# Patient Record
Sex: Female | Born: 1960 | Race: White | Hispanic: No | Smoking: Current every day smoker
Health system: Southern US, Community
[De-identification: ages and names within clinical notes are randomized; demographics above are authoritative.]

## PROBLEM LIST (undated history)

## (undated) DIAGNOSIS — I2510 Atherosclerotic heart disease of native coronary artery without angina pectoris: Secondary | ICD-10-CM

## (undated) DIAGNOSIS — E785 Hyperlipidemia, unspecified: Secondary | ICD-10-CM

## (undated) DIAGNOSIS — Z720 Tobacco use: Secondary | ICD-10-CM

## (undated) DIAGNOSIS — C55 Malignant neoplasm of uterus, part unspecified: Secondary | ICD-10-CM

## (undated) HISTORY — PX: ABDOMINAL HYSTERECTOMY: SHX81

---

## 1997-05-16 ENCOUNTER — Emergency Department (HOSPITAL_COMMUNITY): Admission: EM | Admit: 1997-05-16 | Discharge: 1997-05-16 | Payer: Self-pay | Admitting: Emergency Medicine

## 1999-02-08 ENCOUNTER — Emergency Department (HOSPITAL_COMMUNITY): Admission: EM | Admit: 1999-02-08 | Discharge: 1999-02-08 | Payer: Self-pay | Admitting: Emergency Medicine

## 2000-06-04 ENCOUNTER — Other Ambulatory Visit: Admission: RE | Admit: 2000-06-04 | Discharge: 2000-06-04 | Payer: Self-pay | Admitting: *Deleted

## 2007-11-05 ENCOUNTER — Encounter: Admission: RE | Admit: 2007-11-05 | Discharge: 2007-11-05 | Payer: Self-pay | Admitting: Occupational Medicine

## 2009-03-21 ENCOUNTER — Emergency Department (HOSPITAL_COMMUNITY): Admission: EM | Admit: 2009-03-21 | Discharge: 2009-03-21 | Payer: Self-pay | Admitting: Emergency Medicine

## 2011-03-10 ENCOUNTER — Other Ambulatory Visit: Payer: Self-pay

## 2011-03-10 ENCOUNTER — Emergency Department (HOSPITAL_COMMUNITY): Payer: Self-pay

## 2011-03-10 ENCOUNTER — Encounter (HOSPITAL_COMMUNITY): Payer: Self-pay | Admitting: Emergency Medicine

## 2011-03-10 ENCOUNTER — Emergency Department (HOSPITAL_COMMUNITY)
Admission: EM | Admit: 2011-03-10 | Discharge: 2011-03-10 | Disposition: A | Discharge status: Home Health | Payer: Self-pay | Attending: Emergency Medicine | Admitting: Emergency Medicine

## 2011-03-10 DIAGNOSIS — J449 Chronic obstructive pulmonary disease, unspecified: Secondary | ICD-10-CM | POA: Insufficient documentation

## 2011-03-10 DIAGNOSIS — R0602 Shortness of breath: Secondary | ICD-10-CM | POA: Insufficient documentation

## 2011-03-10 DIAGNOSIS — F17200 Nicotine dependence, unspecified, uncomplicated: Secondary | ICD-10-CM | POA: Insufficient documentation

## 2011-03-10 DIAGNOSIS — J4489 Other specified chronic obstructive pulmonary disease: Secondary | ICD-10-CM | POA: Insufficient documentation

## 2011-03-10 DIAGNOSIS — R079 Chest pain, unspecified: Secondary | ICD-10-CM | POA: Insufficient documentation

## 2011-03-10 LAB — DIFFERENTIAL
Basophils Absolute: 0 10*3/uL (ref 0.0–0.1)
Eosinophils Absolute: 0.1 10*3/uL (ref 0.0–0.7)
Eosinophils Relative: 2 % (ref 0–5)
Monocytes Absolute: 0.3 10*3/uL (ref 0.1–1.0)

## 2011-03-10 LAB — CBC
Hemoglobin: 14.2 g/dL (ref 12.0–15.0)
MCHC: 32.3 g/dL (ref 30.0–36.0)
WBC: 6.4 10*3/uL (ref 4.0–10.5)

## 2011-03-10 LAB — BASIC METABOLIC PANEL
Calcium: 9.3 mg/dL (ref 8.4–10.5)
Sodium: 144 mEq/L (ref 135–145)

## 2011-03-10 MED ORDER — ALBUTEROL SULFATE HFA 108 (90 BASE) MCG/ACT IN AERS
2.0000 | INHALATION_SPRAY | RESPIRATORY_TRACT | Status: DC | PRN
  Administered 2011-03-10: 2 via RESPIRATORY_TRACT
  Filled 2011-03-10: qty 6.7

## 2011-03-10 MED ORDER — AEROCHAMBER PLUS W/MASK MISC
Status: AC
  Administered 2011-03-10: 22:00:00
  Filled 2011-03-10: qty 1

## 2011-03-10 NOTE — Discharge Instructions (Signed)
Chronic Obstructive Pulmonary Disease Chronic obstructive pulmonary disease (COPD) is a lung disease. The lungs become damaged, making it hard to get air in and out of your lungs. The damage to your lungs cannot be changed.  HOME CARE  Stop smoking if you smoke. Avoid secondhand smoke.   Only take medicine as told by your doctor.   Talk to your doctor about using cough syrup or over-the-counter medicines.   Drink enough fluids to keep your pee (urine) clear or pale yellow.   Use a humidifier or vaporizer. This may help loosen the thick spit (mucus).   Talk to your doctor about vaccines that help prevent other lung problems (pneumonia and flu vaccines).   Use home oxygen as told by your doctor.   Stay active and exercise.   Eat healthy foods.  GET HELP RIGHT AWAY IF:   Your heart is beating fast.   You become disturbed, confused, shake, or are dazed.   You have trouble breathing.   You have chest pain.   You have a fever.   You cough up thick spit that is yellowish-white or green.   Your breathing becomes worse when you exercise.   You are running out of the medicine you take for your breathing.  MAKE SURE YOU:   Understand these instructions.   Will watch your condition.   Will get help right away if you are not doing well or get worse.  Document Released: 06/06/2007 Document Revised: 12/07/2010 Document Reviewed: 02/17/2010 [facility] Patient Information 2012 [place], [place]. Please make an appointment with Dr. Nicholos Johns as soon as possible for pulmonary function tests and further treatment for your new diagnosis of COPD, you be given an inhaler that she can use if you feel short of breath or having any just chest discomfort.  If the chest discomfort causes you to become nauseated or diaphoretic./Sweaty, please return to the emergency room for further evaluationUsing a Nebulizer If you have asthma or other breathing problems, you might need to breathe in (inhale)  medication. This can be done with a nebulizer. A nebulizer is a container that turns liquid medication into a mist that you can inhale. There are different kinds of nebulizers. Most are small. With some, you breathe in through a mouthpiece. With others, a mask fits over your nose and mouth. Most nebulizers must be connected to a small air compressor. Some compressors can run on a battery or can be plugged into an electrical outlet. Air is forced through tubing from the compressor to the nebulizer. The forced air changes the liquid into a fine spray. PREPARATION  Check your medication. Make sure it has not expired and is not damaged in any way.   Wash your hands with soap and water.   Put all the parts of your nebulizer on a sturdy, flat surface. Make sure the tubing connects the compressor and the nebulizer.   Measure the liquid medication according to your caregiver's instructions. Pour it into the nebulizer.   Attach the mouthpiece or mask.   To test the nebulizer, turn it on to make sure a spray is coming out. Then, turn it off.  USING THE NEBULIZER  When using your nebulizer, remember to:   Sit down.   Stay relaxed.   Stop the machine if you start coughing.   Stop the machine if the medication foams or bubbles.   To begin:   If your nebulizer has a mask, put it over your nose and mouth. If you use  a mouthpiece, put it in your mouth. Press your lips firmly around the mouthpiece.   Turn on the nebulizer.   Some nebulizers have a finger valve. If yours does, cover up the air hole so the air gets to the nebulizer.   Breathe out.   Once the medication begins to mist out, take slow, deep breaths. If there is a finger valve, release it at the end of your breath.   Continue taking slow, deep breaths until the nebulizer is empty.  HOME CARE INSTRUCTIONS  The nebulizer and all its parts must be kept very clean. Follow the manufacturer's instructions for cleaning. With most  nebulizers, you should:  Wash the nebulizer after each use. Use warm water and soap. Rinse it well. Shake the nebulizer to remove extra water. Put it on a clean towel until itis completely dry. To make sure it is dry, put the nebulizer back together. Turn on the compressor for a few minutes. This will blow air through the nebulizer.   Do not wash the tubing or the finger valve.   Store the nebulizer in a dust-free place.   Inspect the filter every week. Replace it any time it looks dirty.   Sometimes the nebulizer will need a more complete cleaning. The instruction booklet should say how often you need to do this.  POSSIBLE COMPLICATIONS The nebulizer might not produce mist, or foam might come out. Sometimes a filter can get clogged or there might be a problem with the air compressor. Parts are usually made of plastic and will wear out. Over time, you may need to replace some of the parts. Check the instruction booklet that came with your nebulizer. It should tell you how to fix problems or who to call for help. The nebulizer must work properly for it to aid your breathing. Have at least 1 extra nebulizer at home. That way, you will always have one when you need it. SEEK MEDICAL CARE IF:   You continue to have difficulty breathing.   You have trouble using the nebulizer.  Document Released: 12/06/2008 Document Revised: 12/07/2010 Document Reviewed: 12/06/2008 [facility] Patient Information 2012 [place], [place].

## 2011-03-10 NOTE — ED Notes (Signed)
PT. REPORTS INTERMITTENT CHEST PAIN FOR 2 MONTHS , DENIES SOB OR NAUSEA.

## 2011-03-10 NOTE — ED Notes (Signed)
Pt st's she had mid chest pain earlier today, took ASA and pain continued.  Pt st's after parking car and walking to ED from parking lot pain subsided.  Pt denies any chest pain at this time.

## 2011-03-10 NOTE — ED Provider Notes (Signed)
History     CSN: [id number]  Arrival date & time 03/10/11  1955   First MD Initiated Contact with Patient 03/10/11 2137      Chief Complaint  Patient presents with  . Chest Pain    (Consider location/radiation/quality/duration/timing/severity/associated sxs/prior treatment) HPI Comments: Patient states she's had intermittent chest pain, in various locations  of her chest,  for the last month or so exacerbated with stress activity smoking.  She she has been a greater than 2 pack a day smoker.  She has been cutting down and now is less than 1 pack per day.  The pain is not associated with diaphoresis, nausea, she does not have a local doctor, has no chronic medical conditions.  She has noticed that with this pain that she is slightly short of breath.  It is dull in nature and aching and with a slight heavy sensation.  Tonight she became concerned because it was centraly located with shortness of breath and  pressure in nature.  She did take an aspirin,  while she was walking from the parking lot to the emergency room the chest pain, resolved.  She is currently pain-free  Patient is a 51 y.o. female presenting with chest pain. The history is provided by the patient.  Chest Pain The chest pain began more  than 1 month ago. Chest pain occurs intermittently. The chest pain is resolved. The pain is associated with stress and breathing. At its most intense, the pain is at 4/10. The pain is currently at 0/10. The quality of the pain is described as aching. The pain does not radiate. Chest pain is worsened by stress, certain positions and deep breathing. Primary symptoms include shortness of breath. Pertinent negatives for primary symptoms include no fever, no cough, no wheezing, no palpitations, no nausea, no vomiting and no dizziness.  Pertinent negatives for associated symptoms include no weakness. She tried nothing for the symptoms. Risk factors include smoking/tobacco exposure and stress.      History reviewed. No pertinent past medical history.  Past Surgical History  Procedure Date  . Abdominal hysterectomy     No family history on file.  History  Substance Use Topics  . Smoking status: Current Everyday Smoker  . Smokeless tobacco: Not on file  . Alcohol Use: No    OB History    Grav Para Term Preterm Abortions TAB SAB Ect Mult Living                  Review of Systems  Constitutional: Negative for fever.  HENT: Negative for rhinorrhea.   Respiratory: Positive for shortness of breath. Negative for cough and wheezing.   Cardiovascular: Positive for chest pain. Negative for palpitations and leg swelling.  Gastrointestinal: Negative for nausea and vomiting.  Neurological: Negative for dizziness, weakness and headaches.    Allergies  Review of patient's allergies indicates no known allergies.  Home Medications   Current Outpatient Rx  Name Route Sig Dispense Refill  . ASPIRIN 325 MG PO TABS Oral Take 650 mg by mouth daily as needed. For pain    . IBUPROFEN 200 MG PO TABS Oral Take 200 mg by mouth every 6 (six) hours as needed. For pain      BP 131/78  Pulse 86  Temp(Src) 98.5 F (36.9 C) (Oral)  Resp 20  SpO2 95%  Physical Exam  Constitutional: She is oriented to person, place, and time. She appears well-developed and well-nourished.  HENT:  Head: Normocephalic.  Eyes: Pupils  are equal, round, and reactive to light.  Cardiovascular: Normal rate.   Pulmonary/Chest: Effort normal. No respiratory distress. She has wheezes. She has no rales. She exhibits no tenderness.  Abdominal: Soft. Bowel sounds are normal.  Musculoskeletal: Normal range of motion.  Neurological: She is alert and oriented to person, place, and time.  Skin: Skin is warm.    ED Course  Procedures (including critical care time)  Labs Reviewed  BASIC METABOLIC PANEL - Abnormal; Notable for the following:    Glucose, Bld 120 (*)    All other components within normal  limits  CBC  DIFFERENTIAL  POCT I-STAT TROPONIN I   Dg Chest 2 View  03/10/2011  *RADIOLOGY REPORT*  Clinical Data: Centralized chest pain.  Smoker.  CHEST - 2 VIEW  Comparison: 03/21/2009.  Findings: Normal sized heart.  Clear lungs.  Mild flattening of the hemidiaphragms and mild diffuse peribronchial thickening.  Mild scoliosis.  No significant change in a minimal compression deformity of the T11 vertebral body.  IMPRESSION: Mild changes of COPD and chronic bronchitis with mild progression. No acute abnormality.  Original Report Authenticated By: Darrol Angel, M.D.     No diagnosis found.  ED ECG REPORT   Date: 03/10/2011  EKG Time: 10:15 PM  Rate:60  Rhythm: normal sinus rhythm,  unchanged from previous tracings  Axis: normal  Intervals:none  ST&T Change: slight dip in T wave   Narrative Interpretation: abnormal    , troponin, negative pivot.  This is the only troponin that is necessary since his pain has been going on for over a month intermittently on a daily basis, and her chest x-ray shows COPD, chronic bronchitis .          MDM  Discussed  at length.  X-ray findings, as well as lab results and EKG Will start patient on when necessary albuterol.  She does have a doctor that she can become established with in her local community.  I have encouraged her to cut down even further or stop smoking, which she states she will do, immediately.         Arman Filter, NP 03/10/11 2219

## 2011-03-11 NOTE — ED Provider Notes (Signed)
Medical screening examination/treatment/procedure(s) were performed by non-physician practitioner and as supervising physician I was immediately available for consultation/collaboration.   Gwyneth Sprout, MD 03/11/11 [phone]

## 2015-01-13 ENCOUNTER — Encounter (HOSPITAL_COMMUNITY): Payer: Self-pay | Admitting: Emergency Medicine

## 2015-01-13 ENCOUNTER — Encounter (HOSPITAL_COMMUNITY)
Admission: EM | Disposition: A | Discharge status: Home / Self Care | Payer: Self-pay | Source: Home / Self Care | Attending: Cardiology

## 2015-01-13 ENCOUNTER — Other Ambulatory Visit: Payer: Self-pay | Admitting: *Deleted

## 2015-01-13 ENCOUNTER — Inpatient Hospital Stay (HOSPITAL_COMMUNITY): Payer: PRIVATE HEALTH INSURANCE

## 2015-01-13 ENCOUNTER — Inpatient Hospital Stay (HOSPITAL_COMMUNITY)
Admission: EM | Admit: 2015-01-13 | Discharge: 2015-01-18 | DRG: 247 | Disposition: A | Discharge status: Home / Self Care | Payer: PRIVATE HEALTH INSURANCE | Attending: Interventional Cardiology | Admitting: Interventional Cardiology

## 2015-01-13 ENCOUNTER — Emergency Department (HOSPITAL_COMMUNITY): Payer: PRIVATE HEALTH INSURANCE

## 2015-01-13 DIAGNOSIS — I2510 Atherosclerotic heart disease of native coronary artery without angina pectoris: Secondary | ICD-10-CM

## 2015-01-13 DIAGNOSIS — R001 Bradycardia, unspecified: Secondary | ICD-10-CM | POA: Diagnosis present

## 2015-01-13 DIAGNOSIS — Z8249 Family history of ischemic heart disease and other diseases of the circulatory system: Secondary | ICD-10-CM

## 2015-01-13 DIAGNOSIS — E785 Hyperlipidemia, unspecified: Secondary | ICD-10-CM | POA: Diagnosis present

## 2015-01-13 DIAGNOSIS — Z8542 Personal history of malignant neoplasm of other parts of uterus: Secondary | ICD-10-CM | POA: Diagnosis not present

## 2015-01-13 DIAGNOSIS — F17200 Nicotine dependence, unspecified, uncomplicated: Secondary | ICD-10-CM | POA: Diagnosis present

## 2015-01-13 DIAGNOSIS — I214 Non-ST elevation (NSTEMI) myocardial infarction: Secondary | ICD-10-CM | POA: Diagnosis present

## 2015-01-13 DIAGNOSIS — Z720 Tobacco use: Secondary | ICD-10-CM | POA: Diagnosis present

## 2015-01-13 DIAGNOSIS — R079 Chest pain, unspecified: Secondary | ICD-10-CM

## 2015-01-13 DIAGNOSIS — I25110 Atherosclerotic heart disease of native coronary artery with unstable angina pectoris: Secondary | ICD-10-CM

## 2015-01-13 HISTORY — PX: CARDIAC CATHETERIZATION: SHX172

## 2015-01-13 HISTORY — DX: Hyperlipidemia, unspecified: E78.5

## 2015-01-13 HISTORY — DX: Tobacco use: Z72.0

## 2015-01-13 HISTORY — DX: Malignant neoplasm of uterus, part unspecified: C55

## 2015-01-13 HISTORY — DX: Atherosclerotic heart disease of native coronary artery without angina pectoris: I25.10

## 2015-01-13 LAB — I-STAT TROPONIN, ED: TROPONIN I, POC: 4.21 ng/mL — AB (ref 0.00–0.08)

## 2015-01-13 LAB — D-DIMER, QUANTITATIVE (NOT AT ARMC): D DIMER QUANT: 0.34 ug{FEU}/mL (ref 0.00–0.50)

## 2015-01-13 LAB — CBC WITH DIFFERENTIAL/PLATELET
BASOS ABS: 0 10*3/uL (ref 0.0–0.1)
Basophils Relative: 0 %
Eosinophils Absolute: 0 10*3/uL (ref 0.0–0.7)
Eosinophils Relative: 0 %
HEMATOCRIT: 41 % (ref 36.0–46.0)
Hemoglobin: 13 g/dL (ref 12.0–15.0)
LYMPHS PCT: 21 %
Lymphs Abs: 1.9 10*3/uL (ref 0.7–4.0)
MCH: 29.7 pg (ref 26.0–34.0)
MCHC: 31.7 g/dL (ref 30.0–36.0)
MCV: 93.8 fL (ref 78.0–100.0)
MONO ABS: 0.6 10*3/uL (ref 0.1–1.0)
MONOS PCT: 7 %
NEUTROS ABS: 6.4 10*3/uL (ref 1.7–7.7)
Neutrophils Relative %: 72 %
Platelets: 210 10*3/uL (ref 150–400)
RBC: 4.37 MIL/uL (ref 3.87–5.11)
RDW: 13.1 % (ref 11.5–15.5)
WBC: 9 10*3/uL (ref 4.0–10.5)

## 2015-01-13 LAB — BASIC METABOLIC PANEL
Anion gap: 5 (ref 5–15)
BUN: 11 mg/dL (ref 6–20)
CHLORIDE: 113 mmol/L — AB (ref 101–111)
CO2: 25 mmol/L (ref 22–32)
Calcium: 9 mg/dL (ref 8.9–10.3)
Creatinine, Ser: 0.7 mg/dL (ref 0.44–1.00)
GFR calc Af Amer: >60 mL/min (ref >60)
GFR calc non Af Amer: >60 mL/min (ref >60)
GLUCOSE: 128 mg/dL — AB (ref 65–99)
POTASSIUM: 4.3 mmol/L (ref 3.5–5.1)
Sodium: 143 mmol/L (ref 135–145)

## 2015-01-13 LAB — LIPID PANEL
Cholesterol: 168 mg/dL (ref 0–200)
HDL: 32 mg/dL — AB (ref >40)
LDL CALC: 122 mg/dL — AB (ref 0–99)
Total CHOL/HDL Ratio: 5.3 RATIO
Triglycerides: 68 mg/dL (ref <150)
VLDL: 14 mg/dL (ref 0–40)

## 2015-01-13 LAB — TROPONIN I
TROPONIN I: 5.47 ng/mL — AB (ref <0.031)
TROPONIN I: 6.14 ng/mL — AB (ref <0.031)
Troponin I: 5.46 ng/mL (ref <0.031)

## 2015-01-13 LAB — PROTIME-INR
INR: 1.05 (ref 0.00–1.49)
Prothrombin Time: 13.9 seconds (ref 11.6–15.2)

## 2015-01-13 LAB — MRSA PCR SCREENING: MRSA by PCR: NEGATIVE

## 2015-01-13 LAB — TSH: TSH: 0.417 u[IU]/mL (ref 0.350–4.500)

## 2015-01-13 SURGERY — LEFT HEART CATH AND CORONARY ANGIOGRAPHY

## 2015-01-13 MED ORDER — ONDANSETRON HCL 4 MG/2ML IJ SOLN
4.0000 mg | Freq: Once | INTRAMUSCULAR | Status: AC
  Administered 2015-01-13: 4 mg via INTRAVENOUS
  Filled 2015-01-13: qty 2

## 2015-01-13 MED ORDER — ASPIRIN EC 81 MG PO TBEC: 81.0000 mg | DELAYED_RELEASE_TABLET | Freq: Every day | ORAL | Status: DC

## 2015-01-13 MED ORDER — SODIUM CHLORIDE 0.9 % IV SOLN: 250.0000 mL | INTRAVENOUS | Status: DC | PRN

## 2015-01-13 MED ORDER — ASPIRIN EC 81 MG PO TBEC
81.0000 mg | DELAYED_RELEASE_TABLET | Freq: Every day | ORAL | Status: DC
  Administered 2015-01-14 – 2015-01-16 (×3): 81 mg via ORAL
  Filled 2015-01-13 (×3): qty 1

## 2015-01-13 MED ORDER — HEPARIN BOLUS VIA INFUSION
4000.0000 [IU] | Freq: Once | INTRAVENOUS | Status: AC
  Administered 2015-01-13: 4000 [IU] via INTRAVENOUS
  Filled 2015-01-13: qty 4000

## 2015-01-13 MED ORDER — NITROGLYCERIN 0.4 MG SL SUBL: 0.4000 mg | SUBLINGUAL_TABLET | SUBLINGUAL | Status: DC | PRN

## 2015-01-13 MED ORDER — FENTANYL CITRATE (PF) 100 MCG/2ML IJ SOLN
INTRAMUSCULAR | Status: AC
Start: 2015-01-13 — End: 2015-01-13
  Filled 2015-01-13: qty 2

## 2015-01-13 MED ORDER — VERAPAMIL HCL 2.5 MG/ML IV SOLN
INTRAVENOUS | Status: DC | PRN
  Administered 2015-01-13: 15:00:00 via INTRA_ARTERIAL

## 2015-01-13 MED ORDER — TIROFIBAN HCL IN NACL 5-0.9 MG/100ML-% IV SOLN
INTRAVENOUS | Status: AC
  Filled 2015-01-13: qty 100

## 2015-01-13 MED ORDER — VERAPAMIL HCL 2.5 MG/ML IV SOLN
INTRAVENOUS | Status: AC
  Filled 2015-01-13: qty 2

## 2015-01-13 MED ORDER — HEPARIN SODIUM (PORCINE) 1000 UNIT/ML IJ SOLN
INTRAMUSCULAR | Status: DC | PRN
  Administered 2015-01-13: 4200 [IU] via INTRAVENOUS

## 2015-01-13 MED ORDER — NITROGLYCERIN IN D5W 200-5 MCG/ML-% IV SOLN
INTRAVENOUS | Status: AC
  Administered 2015-01-13: 5 ug
  Filled 2015-01-13: qty 250

## 2015-01-13 MED ORDER — ONDANSETRON HCL 4 MG/2ML IJ SOLN: 4.0000 mg | Freq: Four times a day (QID) | INTRAMUSCULAR | Status: DC | PRN

## 2015-01-13 MED ORDER — SODIUM CHLORIDE 0.9 % IV SOLN
INTRAVENOUS | Status: DC | PRN
  Administered 2015-01-13: 50 mL/h via INTRAVENOUS
  Administered 2015-01-13: 250 mL

## 2015-01-13 MED ORDER — HEPARIN (PORCINE) IN NACL 100-0.45 UNIT/ML-% IJ SOLN
900.0000 [IU]/h | INTRAMUSCULAR | Status: DC
  Administered 2015-01-13: 900 [IU]/h via INTRAVENOUS
  Filled 2015-01-13: qty 250

## 2015-01-13 MED ORDER — MIDAZOLAM HCL 2 MG/2ML IJ SOLN
INTRAMUSCULAR | Status: DC | PRN
  Administered 2015-01-13: 1 mg via INTRAVENOUS

## 2015-01-13 MED ORDER — ASPIRIN 81 MG PO CHEW: 81.0000 mg | CHEWABLE_TABLET | ORAL | Status: DC

## 2015-01-13 MED ORDER — TIROFIBAN HCL IN NACL 5-0.9 MG/100ML-% IV SOLN
0.1500 ug/kg/min | INTRAVENOUS | Status: DC
  Administered 2015-01-13 – 2015-01-17 (×13): 0.15 ug/kg/min via INTRAVENOUS
  Filled 2015-01-13 (×14): qty 100

## 2015-01-13 MED ORDER — MIDAZOLAM HCL 2 MG/2ML IJ SOLN
INTRAMUSCULAR | Status: AC
  Filled 2015-01-13: qty 2

## 2015-01-13 MED ORDER — SODIUM CHLORIDE 0.9 % IJ SOLN: 3.0000 mL | INTRAMUSCULAR | Status: DC | PRN

## 2015-01-13 MED ORDER — FENTANYL CITRATE (PF) 100 MCG/2ML IJ SOLN
50.0000 ug | Freq: Once | INTRAMUSCULAR | Status: AC
  Administered 2015-01-13: 50 ug via INTRAVENOUS
  Filled 2015-01-13: qty 2

## 2015-01-13 MED ORDER — HEPARIN (PORCINE) IN NACL 2-0.9 UNIT/ML-% IJ SOLN
INTRAMUSCULAR | Status: AC
  Filled 2015-01-13: qty 1000

## 2015-01-13 MED ORDER — HEPARIN (PORCINE) IN NACL 100-0.45 UNIT/ML-% IJ SOLN
1350.0000 [IU]/h | INTRAMUSCULAR | Status: DC
  Administered 2015-01-13: 900 [IU]/h via INTRAVENOUS
  Administered 2015-01-15 – 2015-01-17 (×3): 1350 [IU]/h via INTRAVENOUS
  Filled 2015-01-13 (×5): qty 250

## 2015-01-13 MED ORDER — ACETAMINOPHEN 325 MG PO TABS
650.0000 mg | ORAL_TABLET | ORAL | Status: DC | PRN
  Administered 2015-01-14: 650 mg via ORAL
  Filled 2015-01-13: qty 2

## 2015-01-13 MED ORDER — SODIUM CHLORIDE 0.9 % IJ SOLN: 3.0000 mL | Freq: Two times a day (BID) | INTRAMUSCULAR | Status: DC

## 2015-01-13 MED ORDER — LIDOCAINE HCL (PF) 1 % IJ SOLN
INTRAMUSCULAR | Status: AC
  Filled 2015-01-13: qty 30

## 2015-01-13 MED ORDER — NITROGLYCERIN 1 MG/10 ML FOR IR/CATH LAB
INTRA_ARTERIAL | Status: DC | PRN
  Administered 2015-01-13: 15:00:00

## 2015-01-13 MED ORDER — SODIUM CHLORIDE 0.9 % WEIGHT BASED INFUSION: 1.0000 mL/kg/h | INTRAVENOUS | Status: DC

## 2015-01-13 MED ORDER — FENTANYL CITRATE (PF) 100 MCG/2ML IJ SOLN
INTRAMUSCULAR | Status: DC | PRN
  Administered 2015-01-13: 50 ug via INTRAVENOUS

## 2015-01-13 MED ORDER — INFLUENZA VAC SPLIT QUAD 0.5 ML IM SUSY
0.5000 mL | PREFILLED_SYRINGE | INTRAMUSCULAR | Status: AC
  Administered 2015-01-14: 0.5 mL via INTRAMUSCULAR

## 2015-01-13 MED ORDER — ATORVASTATIN CALCIUM 80 MG PO TABS
80.0000 mg | ORAL_TABLET | Freq: Every day | ORAL | Status: DC
  Administered 2015-01-13 – 2015-01-17 (×5): 80 mg via ORAL
  Filled 2015-01-13 (×5): qty 1

## 2015-01-13 MED ORDER — NITROGLYCERIN 1 MG/10 ML FOR IR/CATH LAB
INTRA_ARTERIAL | Status: AC
  Filled 2015-01-13: qty 10

## 2015-01-13 MED ORDER — ASPIRIN EC 81 MG PO TBEC: 81.0000 mg | DELAYED_RELEASE_TABLET | Freq: Once | ORAL | Status: DC

## 2015-01-13 MED ORDER — HEPARIN SODIUM (PORCINE) 1000 UNIT/ML IJ SOLN
INTRAMUSCULAR | Status: AC
  Filled 2015-01-13: qty 1

## 2015-01-13 MED ORDER — ACETAMINOPHEN 325 MG PO TABS: 650.0000 mg | ORAL_TABLET | ORAL | Status: DC | PRN

## 2015-01-13 MED ORDER — SODIUM CHLORIDE 0.9 % IJ SOLN
3.0000 mL | Freq: Two times a day (BID) | INTRAMUSCULAR | Status: DC
  Administered 2015-01-13 – 2015-01-16 (×6): 3 mL via INTRAVENOUS

## 2015-01-13 MED ORDER — ASPIRIN 81 MG PO CHEW: 81.0000 mg | CHEWABLE_TABLET | Freq: Every day | ORAL | Status: DC

## 2015-01-13 MED ORDER — TIROFIBAN (AGGRASTAT) BOLUS VIA INFUSION
INTRAVENOUS | Status: DC | PRN
  Administered 2015-01-13: 2040 ug via INTRAVENOUS

## 2015-01-13 MED ORDER — OXYCODONE-ACETAMINOPHEN 5-325 MG PO TABS: 1.0000 | ORAL_TABLET | ORAL | Status: DC | PRN

## 2015-01-13 MED ORDER — SODIUM CHLORIDE 0.9 % WEIGHT BASED INFUSION
3.0000 mL/kg/h | INTRAVENOUS | Status: AC
  Administered 2015-01-13: 3 mL/kg/h via INTRAVENOUS

## 2015-01-13 MED ORDER — IOHEXOL 350 MG/ML SOLN
INTRAVENOUS | Status: DC | PRN
  Administered 2015-01-13: 110 mL via INTRACARDIAC

## 2015-01-13 MED ORDER — SODIUM CHLORIDE 0.9 % WEIGHT BASED INFUSION: 3.0000 mL/kg/h | INTRAVENOUS | Status: DC

## 2015-01-13 MED ORDER — TIROFIBAN HCL IN NACL 5-0.9 MG/100ML-% IV SOLN
INTRAVENOUS | Status: DC | PRN
  Administered 2015-01-13: 0.15 ug/kg/min via INTRAVENOUS

## 2015-01-13 MED ORDER — ASPIRIN 81 MG PO CHEW
81.0000 mg | CHEWABLE_TABLET | ORAL | Status: AC
  Administered 2015-01-13: 81 mg via ORAL
  Filled 2015-01-13: qty 1

## 2015-01-13 SURGICAL SUPPLY — 8 items
CATH IMPULSE 5F ANG/FL3.5 (CATHETERS) ×3 IMPLANT
DEVICE RAD COMP TR BAND LRG (VASCULAR PRODUCTS) ×3 IMPLANT
GLIDESHEATH SLEND A-KIT 6F 22G (SHEATH) ×3 IMPLANT
KIT HEART LEFT (KITS) ×3 IMPLANT
PACK CARDIAC CATHETERIZATION (CUSTOM PROCEDURE TRAY) ×3 IMPLANT
TRANSDUCER W/STOPCOCK (MISCELLANEOUS) ×3 IMPLANT
TUBING CIL FLEX 10 FLL-RA (TUBING) ×3 IMPLANT
WIRE SAFE-T 1.5MM-J .035X260CM (WIRE) ×3 IMPLANT

## 2015-01-13 NOTE — ED Notes (Signed)
Attempted to call report to floor x 1. Informed by secretary that floor nurse in a patient's room. Will call back to give report.

## 2015-01-13 NOTE — ED Provider Notes (Signed)
TIME SEEN:  By signing my name below, I, Arianna Nassar, attest that this documentation has been prepared under the direction and in the presence of [employer], DO. Electronically Signed: Julien Nordmann, ED Scribe. 01/13/2015. 1:14 AM.   CHIEF COMPLAINT: Chest Pain  HPI:  HPI Comments: Kathy Mclean is a 55 y.o. female with no past medical history who presents to the Emergency Department complaining of constant, gradual worsening, pressure-like mid chest pain onset last night around 10:30pm. Pt was getting ready for work when she started having chest pain. She reports having associated shortness of breath, nausea, and vomited once which caused her to call EMS. Per EMS, pt's pulse decreased to 38. Pt took 325 mg of aspirin at work and received 4mg  of zofran and 800 ml bolus. She has a maternal hx of MI in her 36s. Pt denies hx of blood clots in legs and lungs. Patient states she is a smoker. She states she does not ever go to her primary care physician say she is not sure she has high blood pressure, diabetes or high cholesterol.  ROS: See HPI Constitutional: no fever  Eyes: no drainage  ENT: no runny nose   Cardiovascular: chest pain  Resp: no SOB  GI: no vomiting GU: no dysuria Integumentary: no rash  Allergy: no hives  Musculoskeletal: no leg swelling  Neurological: no slurred speech ROS otherwise negative  PAST MEDICAL HISTORY/PAST SURGICAL HISTORY:  No past medical history on file.  MEDICATIONS:  Prior to Admission medications   Medication Sig Start Date End Date Taking? Authorizing Provider  aspirin 325 MG tablet Take 650 mg by mouth daily as needed. For pain    Historical Provider, MD  ibuprofen (ADVIL,MOTRIN) 200 MG tablet Take 200 mg by mouth every 6 (six) hours as needed. For pain    Historical Provider, MD    ALLERGIES:  No Known Allergies  SOCIAL HISTORY:  Social History  Substance Use Topics  . Smoking status: Current Every Day Smoker  . Smokeless tobacco:  Not on file  . Alcohol Use: No    FAMILY HISTORY: No family history on file.  EXAM: Triage vitals: BP 122/68 mmHg  Pulse 45  Resp 12  SpO2 100% Temp 97.7 CONSTITUTIONAL: Alert and oriented and responds appropriately to questions. Well-appearing; well-nourished HEAD: Normocephalic EYES: Conjunctivae clear, PERRL ENT: normal nose; no rhinorrhea; moist mucous membranes; pharynx without lesions noted NECK: Supple, no meningismus, no LAD  CARD: RRR; S1 and S2 appreciated; no murmurs, no clicks, no rubs, no gallops, heart rate in 60's when in the room RESP: Normal chest excursion without splinting or tachypnea; breath sounds clear and equal bilaterally; no wheezes, no rhonchi, no rales, no hypoxia or respiratory distress, speaking full sentences ABD/GI: Normal bowel sounds; non-distended; soft, non-tender, no rebound, no guarding, no peritoneal signs BACK:  The back appears normal and is non-tender to palpation, there is no CVA tenderness EXT: Normal ROM in all joints; non-tender to palpation; no edema; normal capillary refill; no cyanosis, no calf tenderness or swelling    SKIN: Normal color for age and race; warm NEURO: Moves all extremities equally, sensation to light touch intact diffusely, cranial nerves II through XII intact PSYCH: The patient's mood and manner are appropriate. Grooming and personal hygiene are appropriate.  MEDICAL DECISION MAKING: Here with concerning story for ACS. EKG shows nonspecific T-wave abnormalities and sinus bradycardia. This is new compared to prior EKG in 2013. Heart rate is in the mid 40s but she is  otherwise hemodynamically stable. Will give fentanyl for pain given systolic blood pressures in the upper 100s. We'll obtain cardiac labs, chest x-ray, d-dimer. I feel she will need admission. Patient is tearful and states she would like to go home if possible.  ED PROGRESS: 2:55 AM Patient is pain-free after fentanyl. Hemodynamically stable. Heart rate in the  60s. Patient's troponin is positive for 4.21. She now states that her symptoms have been present for over one month. She now agrees on admission. Does not have a PCP or cardiologist. Will consult cardiology for NSTEMI.  She did take 325 mg of aspirin at home prior to arrival.  4:15 AM  D/w Dr. Philbert Riser with cardiology for admission.  He will see patient in place orders. Patient is still pain-free, hemodynamically stable.     EKG Interpretation  Date/Time:  Thursday January 13 2015 01:19:46 EST Ventricular Rate:  46 PR Interval:  163 QRS Duration: 99 QT Interval:  472 QTC Calculation: 413 R Axis:   73 Text Interpretation:  Sinus bradycardia Nonspecific T abnormalities, diffuse leads that appear new compared to EKG in 2013 Confirmed by WARD,  DO, KRISTEN [id number]) on 01/13/2015 1:51:05 AM        CRITICAL CARE Performed by: Nyra Jabs   Total critical care time: 45 minutes - NSTEMI, heparin drip  Critical care time was exclusive of separately billable procedures and treating other patients.  Critical care was necessary to treat or prevent imminent or life-threatening deterioration.  Critical care was time spent personally by me on the following activities: development of treatment plan with patient and/or surrogate as well as nursing, discussions with consultants, evaluation of patient's response to treatment, examination of patient, obtaining history from patient or surrogate, ordering and performing treatments and interventions, ordering and review of laboratory studies, ordering and review of radiographic studies, pulse oximetry and re-evaluation of patient's condition.   I personally performed the services described in this documentation, which was scribed in my presence. The recorded information has been reviewed and is accurate.   [place], DO 01/13/15 [phone]

## 2015-01-13 NOTE — Consult Note (Signed)
[address]       [place] [address]             [phone]        Kathy Mclean [facility] Medical Record [id number] Date of Birth: 1960/07/25  Referring: Dr Tamala Julian  Primary Care: No primary care provider on file.  Chief Complaint:    Chief Complaint  Patient presents with  . Chest Pain    History of Present Illness:     Patient  is a 55 y.o. female with no known previous history of cardiac problems who presents with new onset of h CP. She reports that pain started last night prior to going to work. 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 She noted  in center, lower chest described as heaviness. Some associated discomfort in left arm. She drove herself to work but upon arrival pain had gotten worse and she became nauseated and vomited which prompted her to seek medical attention. She reports meds given relieved pain (fentanyl given in ED). She reports having "congestion" over the last month with some occasional heaviness in chest but thought it was a cold. She has had some episodes of similar chest pain over the past year but were resolved quickly unlike this episode. She reports mother had her first MI in her 58s.    Current Activity/ Functional Status: Patient is independent with mobility/ambulation, transfers, ADL's, IADL's. She cares for her disabled boy friend and disabled son   Zubrod Score: At the time of surgery this patient's most appropriate activity status/level should be described as: []     0    Normal activity, no symptoms [x]     1    Restricted in physical strenuous activity but ambulatory, able to do out light work []     2    Ambulatory and capable of self care, unable to do work activities, up and about                 more than 50%  Of the time                            []     3    Only limited self care, in bed greater than 50% of waking hours []     4    Completely disabled, no self care, confined to bed  or chair []     5    Moribund  Past Medical History  Diagnosis Date  . Uterine cancer [facility])     Past Surgical History  Procedure Laterality Date  . Abdominal hysterectomy      History  Smoking status  . Current Every Day Smoker  Smokeless tobacco  . Not on file    History  Alcohol Use No    Social History   Social History  . Marital Status: Widowed    Spouse Name: N/A  . Number of Children: N/A  . Years of Education: N/A   Occupational History  . 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 Smoking status: Current Every Day Smoker for 30 years   . Smokeless tobacco: Not on file  . Alcohol Use: No  . Drug Use: No  . Sexual Activity: Not on file   Other Topics Concern  . Not on file   Social History Narrative    No Known Allergies  Current Facility-Administered  Medications  Medication Dose Route Frequency Provider Last Rate Last Dose  . 0.9 %  sodium chloride infusion  250 mL Intravenous PRN Belva Crome, MD      . acetaminophen (TYLENOL) tablet 650 mg  650 mg Oral Q4H PRN Alphia Moh, MD      . Derrill Memo ON 01/14/2015] aspirin chewable tablet 81 mg  81 mg Oral Daily Belva Crome, MD      . Derrill Memo ON 01/14/2015] aspirin EC tablet 81 mg  81 mg Oral Daily Belva Crome, MD      . atorvastatin (LIPITOR) tablet 80 mg  80 mg Oral q1800 Alphia Moh, MD   80 mg at 01/13/15 1745  . heparin ADULT infusion 100 units/mL (25000 units/250 mL)  900 Units/hr Intravenous Continuous Priscella Mann, RPH      . nitroGLYCERIN (NITROSTAT) SL tablet 0.4 mg  0.4 mg Sublingual Q5 Min x 3 PRN Alphia Moh, MD      . ondansetron (ZOFRAN) injection 4 mg  4 mg Intravenous Q6H PRN Alphia Moh, MD      . oxyCODONE-acetaminophen (PERCOCET/ROXICET) 5-325 MG per tablet 1-2 tablet  1-2 tablet Oral Q4H PRN Belva Crome, MD      . sodium chloride 0.9 % injection 3 mL  3 mL Intravenous Q12H Belva Crome, MD      . sodium chloride 0.9 % injection 3 mL  3 mL  Intravenous PRN Belva Crome, MD      . tirofiban (AGGRASTAT) infusion 50 mcg/mL 100 mL  0.15 mcg/kg/min Intravenous Continuous Belva Crome, MD 14.7 mL/hr at 01/13/15 1800 0.15 mcg/kg/min at 01/13/15 1800    Prescriptions prior to admission  Medication Sig Dispense Refill Last Dose  . aspirin 325 MG tablet Take 650 mg by mouth daily as needed. For pain   01/12/2015 at Unknown time  . ibuprofen (ADVIL,MOTRIN) 200 MG tablet Take 200 mg by mouth every 6 (six) hours as needed. For pain   Past Month at Unknown time    Family History  Problem Relation Age of Onset  . Heart attack Mother      Review of Systems:     Cardiac Review of Systems: Y or N  Chest Pain [  y ]  Resting SOB [ n  ] Exertional SOB  Blue.Reese  ]  Orthopnea Florencio.Farrier  ]   Pedal Edema [ n  ]    Palpitations Florencio.Farrier  ] Syncope  [ n ]   Presyncope [ n  ]  General Review of Systems: [Y] = yes [  ]=no Constitional: recent weight change [n  ]; anorexia [  ]; fatigue [ y ]; nausea [  ]; night sweats [  ]; fever [  ]; or chills [  ]                                                               Dental: poor dentition[  ]; Last Dentist visit:   Eye : blurred vision [  ]; diplopia [   ]; vision changes [  ];  Amaurosis fugax[  ]; Resp: cough [  ];  wheezing[y  ];  hemoptysis[ n ]; shortness of breath[y  ]; paroxysmal nocturnal dyspnea[  ]; dyspnea on exertion[y  ];  or orthopnea[  ];  GI:  gallstones[  ], vomiting[  ];  dysphagia[  ]; melena[  ];  hematochezia [  ]; heartburn[  ];   Hx of  Colonoscopy[  ]; GU: kidney stones [  ]; hematuria[  ];   dysuria [  ];  nocturia[  ];  history of     obstruction [  ]; urinary frequency [ n ]             Skin: rash, swelling[  ];, hair loss[  ];  peripheral edema[  ];  or itching[  ]; Musculosketetal: myalgias[  ];  joint swelling[  ];  joint erythema[  ];  joint pain[  ];  back pain[  ];  Heme/Lymph: bruising[ n ];  bleeding[ n ];  anemia[  ];  Neuro: TIA[  ];  headaches[  ];  stroke[  ];  vertigo[  ];   seizures[ n ];   paresthesias[  ];  difficulty walking[n  ];  Psych:depression[  ]; anxiety[  ];  Endocrine: diabetes[ n ];  thyroid dysfunction[ n ];  Immunizations: Flu [ n ]; Pneumococcal[  n];  Other:  Physical Exam: BP 104/57 mmHg  Pulse 56  Temp(Src) 98 F (36.7 C) (Oral)  Resp 15  Ht 5\' 5"  (1.651 m)  Wt 180 lb (81.647 kg)  BMI 29.95 kg/m2  SpO2 96%   General appearance: alert, cooperative, appears older than stated age and no distress Head: Normocephalic, without obvious abnormality, atraumatic Neck: no adenopathy, no carotid bruit, no JVD, supple, symmetrical, trachea midline and thyroid not enlarged, symmetric, no tenderness/mass/nodules Lymph nodes: Cervical, supraclavicular, and axillary nodes normal. Resp: diminished breath sounds bibasilar Back: symmetric, no curvature. ROM normal. No CVA tenderness. Cardio: regular rate and rhythm, S1, S2 normal, no murmur, click, rub or gallop GI: soft, non-tender; bowel sounds normal; no masses,  no organomegaly Extremities: extremities normal, atraumatic, no cyanosis or edema and Homans sign is negative, no sign of DVT Neurologic: Grossly normal Cardiac cath done through right radial artery dressing intact without hematoma Diagnostic Studies & Laboratory data:     Recent Radiology Findings:   Dg Chest 2 View  01/13/2015  CLINICAL DATA:  Midsternal chest pain. Nausea, vomiting, and shortness of breath. EXAM: CHEST  2 VIEW COMPARISON:  03/10/2011 FINDINGS: Heart size and mediastinal contours are unchanged. There is hyperinflation and coarsened interstitial markings, chronic. No superimposed pulmonary edema, consolidation, pleural effusion or pneumothorax. Osseous structures appear intact. Mid anterior wedging of lower thoracic vertebra is unchanged. IMPRESSION: Chronic hyperinflation and coarsened interstitial markings. No superimposed acute process. Electronically Signed   By: Jeb Levering M.D.   On: 01/13/2015 03:11     I have  independently reviewed the above radiologic studies.  Recent Lab Findings: Lab Results  Component Value Date   WBC 9.0 01/13/2015   HGB 13.0 01/13/2015   HCT 41.0 01/13/2015   PLT 210 01/13/2015   GLUCOSE 128* 01/13/2015   CHOL 168 01/13/2015   TRIG 68 01/13/2015   HDL 32* 01/13/2015   LDLCALC 122* 01/13/2015   NA 143 01/13/2015   K 4.3 01/13/2015   CL 113* 01/13/2015   CREATININE 0.70 01/13/2015   BUN 11 01/13/2015   CO2 25 01/13/2015   TSH 0.417 01/13/2015   INR 1.05 01/13/2015   Lab Results  Component Value Date   TROPONINI 5.47* 01/13/2015     Conclusion    1. Prox RCA to Mid RCA lesion, 99% stenosed. 2. Mid LAD to  Dist LAD lesion, 100% stenosed. 3. Ost 1st Diag lesion, 75% stenosed. 4. Ost LAD lesion, 40% stenosed. 5. Ost Cx to Mid Cx lesion, 30% stenosed. 6. Dist LAD lesion, 100% stenosed.   Severe two-vessel coronary disease with chronic total occlusion of the proximal to mid LAD and high-grade acute thrombotic stenosis in the proximal to mid RCA (dominant vessel).  The LAD is diffusely diseased and is collateralized from left to left and from right to left. May or may not have surgical targets.The first diagonal which arises proximal to the total occlusion contains eccentric 70% stenosis and could be a target for bypass.  RCA is very dominant, gives collaterals to the apical LAD via an acute RCA marginal branch. The RCA is also receiving collaterals from the left circumflex. Massive thrombus is noted in the proximal to mid RCA. Distal flow is TIMI grade 2.  Preserved left ventricular systolic function with EF 55%.   RECOMMENDATIONS:   Heart team approach. Surgical revascularization options need to be explored. RCA is obviously graftable but LAD may not be.  If surgical options are not favorable, consider prolonged anticoagulation followed by percutaneous intervention of the right coronary to possibly include Angio-Jet thrombectomy. At a separate  setting, CTO PCI of the LAD would be considered. Risk of intervention on the RCA is that of distal embolization and loss of collateral support with subsequent significant myocardial injury.   IV Aggrastat for 3-4 days and IV heparin if tolerated without bleeding or thrombocytopenia.     I have independently reviewed the above  cath films and reviewed the findings with the  patient .  ECHO: Study Conclusions  - Left ventricle: The cavity size was normal. There was mild concentric hypertrophy. Systolic function was vigorous. The estimated ejection fraction was in the range of 65% to 70%. Wall motion was normal; there were no regional wall motion abnormalities. Doppler parameters are consistent with abnormal left ventricular relaxation (grade 1 diastolic dysfunction). Doppler parameters are consistent with elevated ventricular end-diastolic filling pressure. - Aortic valve: Trileaflet; normal thickness leaflets. - Mitral valve: Structurally normal valve. There was mild regurgitation. - Left atrium: The atrium was normal in size. - Right ventricle: The cavity size was normal. Wall thickness was normal. Systolic function was normal. - Right atrium: The atrium was normal in size. - Pulmonary arteries: Systolic pressure was within the normal range. - Inferior vena cava: The vessel was normal in size. - Pericardium, extracardiac: There was no pericardial effusion.   Assessment / Plan:    NSTEMI (non-ST elevated myocardial infarction) ([place])  long term smoking history  I sent patient and reviewed the cardiac catheterization and echo films, assessment is very similar to Dr. Thompson Caul conclusions. Obviously the right coronary artery is graftable, and the patient would be a suitable candidate for coronary artery bypass grafting. The circumflex is without significant disease and does not need bypass. Unfortunately the LAD appears to be chronically occluded and very  diffusely diseased and may not be graftable at the time of surgery. The patient is not diabetic. Consideration for angioplasty of the right coronary artery and then intensive medical therapy including smoking sensation is a reasonable approach. I will further discuss the case with Dr. Tamala Julian and the patient tomorrow. With the current anatomy if she was to become unstable immediate angioplasty and stenting of the right coronary artery would be the thing to do.     I  spent 40 minutes counseling the patient face to face and 50% or more  the  time was spent in counseling and coordination of care. The total time spent in the appointment was 60 minutes.    Grace Isaac MD      [address] [place] [address] Office [phone]   Beeper [phone]  01/13/2015 7:21 PM

## 2015-01-13 NOTE — Progress Notes (Signed)
  Echocardiogram 2D Echocardiogram has been performed.  Jennette Dubin 01/13/2015, 4:46 PM

## 2015-01-13 NOTE — H&P (View-Only) (Signed)
Patient Name: Kathy Mclean Date of Encounter: 01/13/2015     Active Problems:   NSTEMI (non-ST elevated myocardial infarction) ([place])    SUBJECTIVE  Still having mild chest discomfort this am.  EKG shows no new changes from admission. Rhythm sinus bradycardia.  CURRENT MEDS . [START ON 01/14/2015] aspirin EC  81 mg Oral Daily  . atorvastatin  80 mg Oral q1800    OBJECTIVE  Filed Vitals:   01/13/15 0500 01/13/15 0515 01/13/15 0515 01/13/15 0700  BP: 110/64 109/70  113/66  Pulse: 65 63  52  Temp:    98.3 F (36.8 C)  TempSrc:    Oral  Resp: 17 19  18   Height:   5\' 5"  (1.651 m)   Weight:   180 lb (81.647 kg)   SpO2: 92% 93%  94%    Intake/Output Summary (Last 24 hours) at 01/13/15 0756 Last data filed at 01/13/15 0626  Gross per 24 hour  Intake      0 ml  Output      0 ml  Net      0 ml   Filed Weights   01/13/15 0515  Weight: 180 lb (81.647 kg)    PHYSICAL EXAM  General: Pleasant, NAD. Neuro: Alert and oriented X 3. Moves all extremities spontaneously. Psych: Normal affect. HEENT:  Normal  Neck: Supple without bruits or JVD. Lungs:  Resp regular and unlabored, CTA. Heart: RRR no s3, s4, or murmurs. Abdomen: Soft, non-tender, non-distended, BS + x 4.  Extremities: No clubbing, cyanosis or edema. DP/PT/Radials 2+ and equal bilaterally. Good radial pulse  Accessory Clinical Findings  CBC  Recent Labs  01/13/15 0234  WBC 9.0  NEUTROABS 6.4  HGB 13.0  HCT 41.0  MCV 93.8  PLT A999333   Basic Metabolic Panel  Recent Labs  01/13/15 0234  NA 143  K 4.3  CL 113*  CO2 25  GLUCOSE 128*  BUN 11  CREATININE 0.70  CALCIUM 9.0   Liver Function Tests No results for input(s): AST, ALT, ALKPHOS, BILITOT, PROT, ALBUMIN in the last 72 hours. No results for input(s): LIPASE, AMYLASE in the last 72 hours. Cardiac Enzymes No results for input(s): CKTOTAL, CKMB, CKMBINDEX, TROPONINI in the last 72 hours. BNP Invalid input(s):  POCBNP D-Dimer  Recent Labs  01/13/15 0234  DDIMER 0.34   Hemoglobin A1C No results for input(s): HGBA1C in the last 72 hours. Fasting Lipid Panel No results for input(s): CHOL, HDL, LDLCALC, TRIG, CHOLHDL, LDLDIRECT in the last 72 hours. Thyroid Function Tests No results for input(s): TSH, T4TOTAL, T3FREE, THYROIDAB in the last 72 hours.  Invalid input(s): FREET3  TELE  Sinus bradycardia  ECG  NSR. Nonspecific T wave changes.  Radiology/Studies  Dg Chest 2 View  01/13/2015  CLINICAL DATA:  Midsternal chest pain. Nausea, vomiting, and shortness of breath. EXAM: CHEST  2 VIEW COMPARISON:  03/10/2011 FINDINGS: Heart size and mediastinal contours are unchanged. There is hyperinflation and coarsened interstitial markings, chronic. No superimposed pulmonary edema, consolidation, pleural effusion or pneumothorax. Osseous structures appear intact. Mid anterior wedging of lower thoracic vertebra is unchanged. IMPRESSION: Chronic hyperinflation and coarsened interstitial markings. No superimposed acute process. Electronically Signed   By: Jeb Levering M.D.   On: 01/13/2015 03:11    ASSESSMENT AND PLAN  1.NSTEMI 2.Tobacco abuse  Plan: Left heart cath today.The risks and benefits of a cardiac catheterization including, but not limited to, death, stroke, MI, kidney damage and bleeding were discussed with the patient who  indicates understanding and agrees to proceed.  2D echo.  Signed, Warren Danes MD

## 2015-01-13 NOTE — Progress Notes (Signed)
Patient Name: Kathy Mclean Date of Encounter: 01/13/2015     Active Problems:   NSTEMI (non-ST elevated myocardial infarction) ([place])    SUBJECTIVE  Still having mild chest discomfort this am.  EKG shows no new changes from admission. Rhythm sinus bradycardia.  CURRENT MEDS . [START ON 01/14/2015] aspirin EC  81 mg Oral Daily  . atorvastatin  80 mg Oral q1800    OBJECTIVE  Filed Vitals:   01/13/15 0500 01/13/15 0515 01/13/15 0515 01/13/15 0700  BP: 110/64 109/70  113/66  Pulse: 65 63  52  Temp:    98.3 F (36.8 C)  TempSrc:    Oral  Resp: 17 19  18   Height:   5\' 5"  (1.651 m)   Weight:   180 lb (81.647 kg)   SpO2: 92% 93%  94%    Intake/Output Summary (Last 24 hours) at 01/13/15 0756 Last data filed at 01/13/15 0626  Gross per 24 hour  Intake      0 ml  Output      0 ml  Net      0 ml   Filed Weights   01/13/15 0515  Weight: 180 lb (81.647 kg)    PHYSICAL EXAM  General: Pleasant, NAD. Neuro: Alert and oriented X 3. Moves all extremities spontaneously. Psych: Normal affect. HEENT:  Normal  Neck: Supple without bruits or JVD. Lungs:  Resp regular and unlabored, CTA. Heart: RRR no s3, s4, or murmurs. Abdomen: Soft, non-tender, non-distended, BS + x 4.  Extremities: No clubbing, cyanosis or edema. DP/PT/Radials 2+ and equal bilaterally. Good radial pulse  Accessory Clinical Findings  CBC  Recent Labs  01/13/15 0234  WBC 9.0  NEUTROABS 6.4  HGB 13.0  HCT 41.0  MCV 93.8  PLT [id number]   Basic Metabolic Panel  Recent Labs  01/13/15 0234  NA 143  K 4.3  CL 113*  CO2 25  GLUCOSE 128*  BUN 11  CREATININE 0.70  CALCIUM 9.0   Liver Function Tests No results for input(s): AST, ALT, ALKPHOS, BILITOT, PROT, ALBUMIN in the last 72 hours. No results for input(s): LIPASE, AMYLASE in the last 72 hours. Cardiac Enzymes No results for input(s): CKTOTAL, CKMB, CKMBINDEX, TROPONINI in the last 72 hours. BNP Invalid input(s):  POCBNP D-Dimer  Recent Labs  01/13/15 0234  DDIMER 0.34   Hemoglobin A1C No results for input(s): HGBA1C in the last 72 hours. Fasting Lipid Panel No results for input(s): CHOL, HDL, LDLCALC, TRIG, CHOLHDL, LDLDIRECT in the last 72 hours. Thyroid Function Tests No results for input(s): TSH, T4TOTAL, T3FREE, THYROIDAB in the last 72 hours.  Invalid input(s): FREET3  TELE  Sinus bradycardia  ECG  NSR. Nonspecific T wave changes.  Radiology/Studies  Dg Chest 2 View  01/13/2015  CLINICAL DATA:  Midsternal chest pain. Nausea, vomiting, and shortness of breath. EXAM: CHEST  2 VIEW COMPARISON:  03/10/2011 FINDINGS: Heart size and mediastinal contours are unchanged. There is hyperinflation and coarsened interstitial markings, chronic. No superimposed pulmonary edema, consolidation, pleural effusion or pneumothorax. Osseous structures appear intact. Mid anterior wedging of lower thoracic vertebra is unchanged. IMPRESSION: Chronic hyperinflation and coarsened interstitial markings. No superimposed acute process. Electronically Signed   By: Jeb Levering M.D.   On: 01/13/2015 03:11    ASSESSMENT AND PLAN  1.NSTEMI 2.Tobacco abuse  Plan: Left heart cath today.The risks and benefits of a cardiac catheterization including, but not limited to, death, stroke, MI, kidney damage and bleeding were discussed with the patient who  indicates understanding and agrees to proceed.  2D echo.  Signed, Warren Danes MD

## 2015-01-13 NOTE — Care Management Note (Signed)
Case Management Note  Patient Details  Name: Kathy Mclean MRN: [id number] Date of Birth: 08-Aug-1960  Subjective/Objective:        Adm w nstemi           Action/Plan:lives at home   Expected Discharge Date:                  Expected Discharge Plan:     In-House Referral:     Discharge planning Services     Post Acute Care Choice:    Choice offered to:     DME Arranged:    DME Agency:     HH Arranged:    [place] Agency:     Status of Service:     Medicare Important Message Given:    Date Medicare IM Given:    Medicare IM give by:    Date Additional Medicare IM Given:    Additional Medicare Important Message give by:     If discussed at [place] of Stay Meetings, dates discussed:    Additional Comments:ur review done  Lacretia Leigh, RN 01/13/2015, 8:37 AM

## 2015-01-13 NOTE — ED Notes (Signed)
Patient transported to X-ray 

## 2015-01-13 NOTE — H&P (Signed)
HPI: [name] is a 55 y.o. female with no known PMH except for tobacco abuse who presents with CP.  She reports that pain started last night prior to going to work.  Pain in center, lower chest described as heaviness.  Some associated discomfort in left arm.  She drove herself to work but upon arrival pain had gotten worse and she became nauseated and vomited which prompted her to seek medical attention. She reports meds given relieved pain (fentanyl given in ED).  She reports having "congestion" over the last month with some occasional heaviness in chest but thought it was a cold.  She has had some episodes of similar chest pain over the past year but were resolved quickly unlike this episode.  She reports mother had her first MI in her 6s.     Review of Systems:     Cardiac Review of Systems: {Y] = yes [ ]  = no  Chest Pain [x    ]  Resting SOB [x   ] Exertional SOB  [  ]  Orthopnea [  ]   Pedal Edema [   ]    Palpitations [  ] Syncope  [  ]   Presyncope [   ]  General Review of Systems: [Y] = yes [  ]=no Constitional: recent weight change [  ]; anorexia [  ]; fatigue [  ]; nausea [  ]; night sweats [  ]; fever [  ]; or chills [  ];                                                                      Dental: poor dentition[  ];   Eye : blurred vision [  ]; diplopia [   ]; vision changes [  ];  Amaurosis fugax[  ]; Resp: cough [  ];  wheezing[  ];  hemoptysis[  ]; shortness of breath[  ]; paroxysmal nocturnal dyspnea[  ]; dyspnea on exertion[  ]; or orthopnea[  ];  GI:  gallstones[  ], vomiting[x  ];  dysphagia[  ]; melena[  ];  hematochezia [  ]; heartburn[  ];   GU: kidney stones [  ]; hematuria[  ];   dysuria [  ];  nocturia[  ];               Skin: rash [  ], swelling[  ];, hair loss[  ];  peripheral edema[  ];  or itching[  ]; Musculosketetal: myalgias[  ];  joint swelling[  ];  joint erythema[  ];  joint pain[  ];  back pain[  ];  Heme/Lymph: bruising[  ];  bleeding[  ];   anemia[  ];  Neuro: TIA[  ];  headaches[  ];  stroke[  ];  vertigo[  ];  seizures[  ];   paresthesias[  ];  difficulty walking[  ];  Psych:depression[  ]; anxiety[  ];  Endocrine: diabetes[  ];  thyroid dysfunction[  ];  Other:  Past Medical History  Diagnosis Date  . Uterine cancer ([employer])    Medications: None   No Known Allergies  Social History   Social History  . Marital Status: Widowed    Spouse Name: N/A  .  Number of Children: N/A  . Years of Education: N/A   Occupational History  . Not on file.   Social History Main Topics  . Smoking status: Current Every Day Smoker  . Smokeless tobacco: Not on file  . Alcohol Use: No  . Drug Use: No  . Sexual Activity: Not on file   Other Topics Concern  . Not on file   Social History Narrative    Family History  Problem Relation Age of Onset  . Heart attack Mother     PHYSICAL EXAM: Filed Vitals:   01/13/15 0345 01/13/15 0500  BP: 99/57 110/64  Pulse: 47 65  Temp:    Resp: 14 17   General:  Well appearing. No respiratory difficulty HEENT: [place], AT, PERRL, EOMI, anicteric Neck: supple. no JVD. Carotids 2+ bilat; no bruits. No lymphadenopathy or thryomegaly appreciated. Cor: PMI nondisplaced. Regular rate & rhythm. No rubs, gallops or murmurs. Lungs: clear Abdomen: soft, nontender, nondistended. No hepatosplenomegaly. No bruits or masses. Good bowel sounds. Extremities: no cyanosis, clubbing, rash, edema Neuro: alert & oriented x 3, cranial nerves grossly intact. moves all 4 extremities w/o difficulty. Affect pleasant.  ECG:  SB with nonspecific T wave changes  Results for orders placed or performed during the hospital encounter of 01/13/15 (from the past 24 hour(s))  CBC with Differential     Status: None   Collection Time: 01/13/15  2:34 AM  Result Value Ref Range   WBC 9.0 4.0 - 10.5 K/uL   RBC 4.37 3.87 - 5.11 MIL/uL   Hemoglobin 13.0 12.0 - 15.0 g/dL   HCT 41.0 36.0 - 46.0 %   MCV 93.8 78.0 - 100.0 fL    MCH 29.7 26.0 - 34.0 pg   MCHC 31.7 30.0 - 36.0 g/dL   RDW 13.1 11.5 - 15.5 %   Platelets 210 150 - 400 K/uL   Neutrophils Relative % 72 %   Neutro Abs 6.4 1.7 - 7.7 K/uL   Lymphocytes Relative 21 %   Lymphs Abs 1.9 0.7 - 4.0 K/uL   Monocytes Relative 7 %   Monocytes Absolute 0.6 0.1 - 1.0 K/uL   Eosinophils Relative 0 %   Eosinophils Absolute 0.0 0.0 - 0.7 K/uL   Basophils Relative 0 %   Basophils Absolute 0.0 0.0 - 0.1 K/uL  Basic metabolic panel     Status: Abnormal   Collection Time: 01/13/15  2:34 AM  Result Value Ref Range   Sodium 143 135 - 145 mmol/L   Potassium 4.3 3.5 - 5.1 mmol/L   Chloride 113 (H) 101 - 111 mmol/L   CO2 25 22 - 32 mmol/L   Glucose, Bld 128 (H) 65 - 99 mg/dL   BUN 11 6 - 20 mg/dL   Creatinine, Ser 0.70 0.44 - 1.00 mg/dL   Calcium 9.0 8.9 - 10.3 mg/dL   GFR calc non Af Amer >60 >60 mL/min   GFR calc Af Amer >60 >60 mL/min   Anion gap 5 5 - 15  D-dimer, quantitative (not at [facility])     Status: None   Collection Time: 01/13/15  2:34 AM  Result Value Ref Range   D-Dimer, Quant 0.34 0.00 - 0.50 ug/mL-FEU  I-stat troponin, ED     Status: Abnormal   Collection Time: 01/13/15  2:38 AM  Result Value Ref Range   Troponin i, poc 4.21 (HH) 0.00 - 0.08 ng/mL   Comment NOTIFIED PHYSICIAN    Comment 3  Dg Chest 2 View  01/13/2015  CLINICAL DATA:  Midsternal chest pain. Nausea, vomiting, and shortness of breath. EXAM: CHEST  2 VIEW COMPARISON:  03/10/2011 FINDINGS: Heart size and mediastinal contours are unchanged. There is hyperinflation and coarsened interstitial markings, chronic. No superimposed pulmonary edema, consolidation, pleural effusion or pneumothorax. Osseous structures appear intact. Mid anterior wedging of lower thoracic vertebra is unchanged. IMPRESSION: Chronic hyperinflation and coarsened interstitial markings. No superimposed acute process. Electronically Signed   By: Jeb Levering M.D.   On: 01/13/2015 03:11     ASSESSMENT: 55 yo  woman with chest pain and initial troponin of 4.2 c/w NSTEMI  PLAN/DISCUSSION: Admit to stepdown given NSTEMI with resolution of pain after fentanyl, bradycardia and low-normal BP.   ASA, heparin, high dose statin No BB given bradycardia Cycle troponin Risk stratify with lipids, A1c Check TSH Will need echo to assess LVF NPO for likely cath today Smoking cessation counseling

## 2015-01-13 NOTE — Progress Notes (Signed)
CRITICAL VALUE ALERT  Critical value received: Troponin 6.14  Date of notification: 01/13/15  Time of notification:  11:40  Critical value read back:No. Lab didn't call.   Nurse who received alert: Cleotis Nipper, RN MD notified (1st page):  Dr. Ellyn Hack   Time of first page: 11:50   Responding MD: Dr. Ellyn Hack  Time MD responded: 11:52

## 2015-01-13 NOTE — Interval H&P Note (Signed)
Cath Lab Visit (complete for each Cath Lab visit)  Clinical Evaluation Leading to the Procedure:   ACS: Yes.    Non-ACS:    Anginal Classification: CCS III  Anti-ischemic medical therapy: Minimal Therapy (1 class of medications)  Non-Invasive Test Results: No non-invasive testing performed  Prior CABG: No previous CABG      History and Physical Interval Note:  01/13/2015 11:52 AM  Kathy Mclean  has presented today for surgery, with the diagnosis of cp  The various methods of treatment have been discussed with the patient and family. After consideration of risks, benefits and other options for treatment, the patient has consented to  Procedure(s): Left Heart Cath and Coronary Angiography (N/A) as a surgical intervention .  The patient's history has been reviewed, patient examined, no change in status, stable for surgery.  I have reviewed the patient's chart and labs.  Questions were answered to the patient's satisfaction.     Sinclair Grooms

## 2015-01-13 NOTE — Progress Notes (Signed)
ANTICOAGULATION CONSULT NOTE - Initial Consult  Pharmacy Consult for Heparin Indication: NSTEMI  No Known Allergies  Patient Measurements: Height: 5\' 5"  (165.1 cm) Weight: 180 lb (81.647 kg) IBW/kg (Calculated) : 57 Heparin Dosing Weight: 74 kg  Vital Signs: Temp: 97.7 F (36.5 C) (01/12 0215) Temp Source: Oral (01/12 0215) BP: 110/64 mmHg (01/12 0500) Pulse Rate: 65 (01/12 0500)  Labs:  Recent Labs  01/13/15 0234  HGB 13.0  HCT 41.0  PLT 210  CREATININE 0.70    Estimated Creatinine Clearance: 83.8 mL/min (by C-G formula based on Cr of 0.7).   Medical History: Past Medical History  Diagnosis Date  . Uterine cancer ([facility])     Medications:  See electronic med rec  Assessment: 55 y.o. F presents with CP. Initial troponin 4.2 so NSTEMI. To begin heparin. Pt likely for cath today. CBC ok on admission.  Goal of Therapy:  Heparin level 0.3-0.7 units/ml Monitor platelets by anticoagulation protocol: Yes   Plan:  Heparin IV bolus 4000 units Heparin gtt at 900 units/hr Will f/u heparin level in 6 hours Daily heparin level and CBC  Sherlon Handing, PharmD, BCPS Clinical pharmacist, pager [phone] 01/13/2015,5:43 AM

## 2015-01-13 NOTE — Progress Notes (Deleted)
ANTICOAGULATION CONSULT NOTE - Follow Up Consult  Pharmacy Consult for Restart Heparin 8 hours post-sheath removal Indication: NSTEMI; Severe two-vessel CAD with possible surgical revascularization  No Known Allergies Patient Measurements: Height: 5\' 5"  (165.1 cm) Weight: 180 lb (81.647 kg) IBW/kg (Calculated) : 57 Heparin Dosing Weight: 74.4 kg Vital Signs: Temp: 98.3 F (36.8 C) (01/12 0700) Temp Source: Oral (01/12 0700) BP: 102/59 mmHg (01/12 1453) Pulse Rate: 66 (01/12 1453) Labs:  Recent Labs  01/13/15 0234 01/13/15 0740 01/13/15 0828  HGB 13.0  --   --   HCT 41.0  --   --   PLT 210  --   --   LABPROT  --   --  13.9  INR  --   --  1.05  CREATININE 0.70  --   --   TROPONINI  --  5.46*  --    Estimated Creatinine Clearance: 83.8 mL/min (by C-G formula based on Cr of 0.7).  Assessment: 55 year old female admitted for ACS now s/p cardiac cath found to have severe two-vessel CAD with possible revascularization to resume IV heparin 8 hours post-sheath removal.   Cath was conducted through R-radial artery. Sheath was removed at 14:55PM and TR band was placed. H/H and platelets are within normal limits. INR baseline 1.05. On heparin prior to procedure at 900 units/hr - stopped for procedure. Aggrastat initiated during procedure and to continue for several days per Cardiology.   Goal of Therapy:  Heparin level 0.3 to 0.5 units/ml while on Aggrastat  Monitor platelets by anticoagulation protocol: Yes   Plan:  Restart Heparin IV at 900 units/hr without a bolus at 2300 PM. Heparin level in 6 hours after restart- ok with AM labs. Daily heparin level and CBC while on therapy.  Monitor for signs and symptoms of bleeding.   Sloan Leiter, PharmD, BCPS Clinical Pharmacist [phone] 01/13/2015,3:24 PM

## 2015-01-13 NOTE — ED Notes (Signed)
Patient arrived via [facility]. EMS reports patient began experiencing mid chest pain at approx 2230. Midsternal pain with strange feeling (denies pain) radiating to L arm. Chest pain accompanied by nausea, vomiting x 1, and shortness of breath. Patient took ASA 324 at home. EMS arrived. 18 gauge in L AC. Zofran 4 mg and NS bolus (800 ml). Patient also reports congestion x 1 month which has increased in past 2 days. NSR on monitor. Pulse decreased to 38 with EMS. Heart rate continues to vary. BP 120/85. 98% on room air. 100% on 4 LPM via nasal cannula.

## 2015-01-14 ENCOUNTER — Inpatient Hospital Stay (HOSPITAL_COMMUNITY): Payer: PRIVATE HEALTH INSURANCE

## 2015-01-14 ENCOUNTER — Encounter (HOSPITAL_COMMUNITY): Payer: Self-pay | Admitting: Interventional Cardiology

## 2015-01-14 LAB — SPIROMETRY WITH GRAPH
FEF 25-75 PRE: 1.85 L/s
FEF 25-75 Post: 2.41 L/sec
FEF2575-%CHANGE-POST: 30 %
FEF2575-%Pred-Post: 92 %
FEF2575-%Pred-Pre: 70 %
FEV1-%CHANGE-POST: 7 %
FEV1-%PRED-POST: 81 %
FEV1-%Pred-Pre: 75 %
FEV1-PRE: 2.1 L
FEV1-Post: 2.26 L
FEV1FVC-%CHANGE-POST: 7 %
FEV1FVC-%Pred-Pre: 98 %
FEV6-%CHANGE-POST: 1 %
FEV6-%PRED-PRE: 78 %
FEV6-%Pred-Post: 78 %
FEV6-PRE: 2.68 L
FEV6-Post: 2.72 L
FEV6FVC-%Change-Post: 0 %
FEV6FVC-%PRED-PRE: 102 %
FEV6FVC-%Pred-Post: 103 %
FVC-%CHANGE-POST: 0 %
FVC-%PRED-POST: 76 %
FVC-%Pred-Pre: 76 %
FVC-POST: 2.72 L
FVC-Pre: 2.71 L
POST FEV6/FVC RATIO: 100 %
PRE FEV6/FVC RATIO: 99 %
Post FEV1/FVC ratio: 83 %
Pre FEV1/FVC ratio: 77 %

## 2015-01-14 LAB — CBC
HEMATOCRIT: 41.1 % (ref 36.0–46.0)
HEMOGLOBIN: 13.1 g/dL (ref 12.0–15.0)
MCH: 30.2 pg (ref 26.0–34.0)
MCHC: 31.9 g/dL (ref 30.0–36.0)
MCV: 94.7 fL (ref 78.0–100.0)
Platelets: 205 10*3/uL (ref 150–400)
RBC: 4.34 MIL/uL (ref 3.87–5.11)
RDW: 13 % (ref 11.5–15.5)
WBC: 7.2 10*3/uL (ref 4.0–10.5)

## 2015-01-14 LAB — HEMOGLOBIN A1C
Hgb A1c MFr Bld: 5.8 % — ABNORMAL HIGH (ref 4.8–5.6)
MEAN PLASMA GLUCOSE: 120 mg/dL

## 2015-01-14 LAB — HEPARIN LEVEL (UNFRACTIONATED)
HEPARIN UNFRACTIONATED: 0.16 [IU]/mL — AB (ref 0.30–0.70)
Heparin Unfractionated: 0.14 IU/mL — ABNORMAL LOW (ref 0.30–0.70)

## 2015-01-14 MED ORDER — ALBUTEROL SULFATE (2.5 MG/3ML) 0.083% IN NEBU
2.5000 mg | INHALATION_SOLUTION | Freq: Once | RESPIRATORY_TRACT | Status: AC
  Administered 2015-01-14: 2.5 mg via RESPIRATORY_TRACT

## 2015-01-14 MED ORDER — ATROPINE SULFATE 0.1 MG/ML IJ SOLN
0.5000 mg | INTRAMUSCULAR | Status: DC | PRN
  Filled 2015-01-14: qty 10

## 2015-01-14 NOTE — Progress Notes (Signed)
ANTICOAGULATION CONSULT NOTE - Follow Up Consult  Pharmacy Consult for heparin post-sheath removal Indication: NSTEMI; Severe two-vessel CAD with possible surgical revascularization  No Known Allergies Patient Measurements: Height: 5\' 5"  (165.1 cm) Weight: 180 lb (81.647 kg) IBW/kg (Calculated) : 57 Heparin Dosing Weight: 74.4 kg Vital Signs: Temp: 98.2 F (36.8 C) (01/13 0803) Temp Source: Oral (01/13 0803) BP: 117/67 mmHg (01/13 0803) Pulse Rate: 62 (01/13 0803) Labs:  Recent Labs  01/13/15 0234 01/13/15 0740 01/13/15 0828 01/13/15 1558 01/13/15 2153 01/14/15 0840  HGB 13.0  --   --   --   --  13.1  HCT 41.0  --   --   --   --  41.1  PLT 210  --   --   --   --  205  LABPROT  --   --  13.9  --   --   --   INR  --   --  1.05  --   --   --   HEPARINUNFRC  --   --   --   --   --  0.14*  CREATININE 0.70  --   --   --   --   --   TROPONINI  --  5.46*  --  5.47* 6.14*  --    Estimated Creatinine Clearance: 83.8 mL/min (by C-G formula based on Cr of 0.7).  Assessment: 55 year old female admitted for ACS now s/p cardiac cath found to have severe two-vessel CAD with possible revascularization heparin resumed.  HL 0.14 this am, cbc stable. She continues on heparin and aggrastat for now.  Goal of Therapy:  Heparin level 0.3 to 0.5 units/ml while on Aggrastat  Monitor platelets by anticoagulation protocol: Yes   Plan:  Increase IV Heparin to 1150 units/hr. Heparin level in 6 hours. Daily heparin level and CBC while on therapy.  Monitor for signs and symptoms of bleeding.   Erin Hearing PharmD., BCPS Clinical Pharmacist Pager [phone] 01/14/2015 11:24 AM

## 2015-01-14 NOTE — Progress Notes (Signed)
Patient Name: Kathy Mclean Date of Encounter: 01/14/2015     Active Problems:   NSTEMI (non-ST elevated myocardial infarction) [facility])    SUBJECTIVE  The patient feels well this morning.  She denies any dizziness.  She is not having chest pain.  She states that her chest feels better this morning than it has in a long time.  EKG today shows evolving inferolateral T-wave inversion. Peak troponin 6.14. she is on IV heparin and IV Aggrastat.  She has been seen by Dr. Servando Snare who will discuss her case with Dr. Tamala Julian.         CURRENT MEDS . aspirin EC  81 mg Oral Daily  . atorvastatin  80 mg Oral q1800  . Influenza vac split quadrivalent PF  0.5 mL Intramuscular Tomorrow-1000  . sodium chloride  3 mL Intravenous Q12H    OBJECTIVE  Filed Vitals:   01/14/15 0539 01/14/15 0600 01/14/15 0700 01/14/15 0803  BP:    117/67  Pulse: 48 48 61 62  Temp:    98.2 F (36.8 C)  TempSrc:    Oral  Resp: 10 12 14 15   Height:      Weight:      SpO2: 95% 98% 99% 99%    Intake/Output Summary (Last 24 hours) at 01/14/15 0816 Last data filed at 01/14/15 0600  Gross per 24 hour  Intake 1789.94 ml  Output    300 ml  Net 1489.94 ml   Filed Weights   01/13/15 0515  Weight: 180 lb (81.647 kg)    PHYSICAL EXAM  General: Pleasant, NAD. Neuro: Alert and oriented X 3. Moves all extremities spontaneously. Psych: Normal affect. HEENT:  Normal  Neck: Supple without bruits or JVD. Lungs:  Resp regular and unlabored, CTA. Heart: RRR no s3, s4, or murmurs. Abdomen: Soft, non-tender, non-distended, BS + x 4.  Extremities: No clubbing, cyanosis or edema. DP/PT/Radials 2+ and equal bilaterally. right wrist stable  Accessory Clinical Findings  CBC  Recent Labs  01/13/15 0234  WBC 9.0  NEUTROABS 6.4  HGB 13.0  HCT 41.0  MCV 93.8  PLT [id number]   Basic Metabolic Panel  Recent Labs  01/13/15 0234  NA 143  K 4.3  CL 113*  CO2 25  GLUCOSE 128*  BUN 11  CREATININE 0.70  CALCIUM 9.0    Liver Function Tests No results for input(s): AST, ALT, ALKPHOS, BILITOT, PROT, ALBUMIN in the last 72 hours. No results for input(s): LIPASE, AMYLASE in the last 72 hours. Cardiac Enzymes  Recent Labs  01/13/15 0740 01/13/15 1558 01/13/15 2153  TROPONINI 5.46* 5.47* 6.14*   BNP Invalid input(s): POCBNP D-Dimer  Recent Labs  01/13/15 0234  DDIMER 0.34   Hemoglobin A1C No results for input(s): HGBA1C in the last 72 hours. Fasting Lipid Panel  Recent Labs  01/13/15 0740  CHOL 168  HDL 32*  LDLCALC 122*  TRIG 68  CHOLHDL 5.3   Thyroid Function Tests  Recent Labs  01/13/15 0740  TSH 0.417    TELE  Sinus bradycardia in the 40s during sleep, comes up to 55-60 when patient is awake  ECG  Personally reviewed.  Sinus bradycardia.  Inferolateral T-wave inversion increased since 01/13/15.  Radiology/Studies  Dg Chest 2 View  01/13/2015  CLINICAL DATA:  Midsternal chest pain. Nausea, vomiting, and shortness of breath. EXAM: CHEST  2 VIEW COMPARISON:  03/10/2011 FINDINGS: Heart size and mediastinal contours are unchanged. There is hyperinflation and coarsened interstitial markings, chronic. No superimposed pulmonary  edema, consolidation, pleural effusion or pneumothorax. Osseous structures appear intact. Mid anterior wedging of lower thoracic vertebra is unchanged. IMPRESSION: Chronic hyperinflation and coarsened interstitial markings. No superimposed acute process. Electronically Signed   By: Jeb Levering M.D.   On: 01/13/2015 03:11    ASSESSMENT AND PLAN  1.  Non-STEMI with severe right coronary artery disease and a chronically occluded mid LAD with right-to-left collateral of distal LAD from right coronary artery.  No Further chest pain.  She had sinus bradycardia during sleep which was asymptomatic.  Continue current regimen of IV heparin and IV Aggrastat . 2. tobacco abuse   Plan: As above.  Further discussion to take place today  with surgeons and Dr.  Tamala Julian.  Continue aggressive IV anticoagulation for now.  Signed, Warren Danes MD

## 2015-01-14 NOTE — Progress Notes (Signed)
[address]       [place] [address]             [phone]                 1 Day Post-Op Procedure(s) (LRB): Left Heart Cath and Coronary Angiography (N/A)  LOS: 1 day   Subjective: Feels better, no chest pain  Objective: Vital signs in last 24 hours: Patient Vitals for the past 24 hrs:  BP Temp Temp src Pulse Resp SpO2  01/14/15 1200 (!) 92/50 mmHg 98.5 F (36.9 C) Oral (!) 51 16 95 %  01/14/15 1100 118/76 mmHg - - (!) 54 - 94 %  01/14/15 0803 117/67 mmHg 98.2 F (36.8 C) Oral 62 15 99 %  01/14/15 0700 - - - 61 14 99 %  01/14/15 0600 - - - (!) 48 12 98 %  01/14/15 0539 - - - (!) 48 10 95 %  01/14/15 0500 - - - 61 17 95 %  01/14/15 0400 - - - (!) 50 16 95 %  01/14/15 0342 103/61 mmHg 98.5 F (36.9 C) Oral (!) 53 16 94 %  01/14/15 0300 - - - (!) 52 15 95 %  01/14/15 0200 - - - (!) 59 17 98 %  01/14/15 0100 - - - 61 15 (!) 89 %  01/14/15 0000 113/65 mmHg 99 F (37.2 C) Oral 65 20 99 %  01/13/15 2300 - - - 60 16 96 %  01/13/15 2200 (!) 108/52 mmHg - - 63 19 91 %  01/13/15 2100 106/61 mmHg - - 64 19 92 %  01/13/15 2000 123/67 mmHg - - 75 16 95 %  01/13/15 1959 - 99.3 F (37.4 C) Oral - - -  01/13/15 1900 115/62 mmHg - - 64 17 95 %  01/13/15 1800 (!) 104/57 mmHg - - (!) 56 15 96 %  01/13/15 1700 114/68 mmHg - - 63 18 91 %  01/13/15 1643 111/64 mmHg 98 F (36.7 C) Oral 68 (!) 22 93 %    Filed Weights   01/13/15 0515  Weight: 180 lb (81.647 kg)    Hemodynamic parameters for last 24 hours:    Intake/Output from previous day: 01/12 0701 - 01/13 0700 In: 1798.9 [P.O.:480; I.V.:1318.9] Out: 300 [Urine:300] Intake/Output this shift: Total I/O In: 374.3 [P.O.:240; I.V.:134.3] Out: -   Scheduled Meds: . aspirin EC  81 mg Oral Daily  . atorvastatin  80 mg Oral q1800  . sodium chloride  3 mL Intravenous Q12H   Continuous Infusions: . heparin 1,150 Units/hr (01/14/15 1100)  . tirofiban 0.15 mcg/kg/min (01/14/15 1600)   PRN  Meds:.sodium chloride, acetaminophen, atropine, nitroGLYCERIN, ondansetron (ZOFRAN) IV, oxyCODONE-acetaminophen, sodium chloride  General appearance: alert and cooperative Neurologic: intact Heart: regular rate and rhythm, S1, S2 normal, no murmur, click, rub or gallop Lungs: diminished breath sounds bibasilar Abdomen: soft, non-tender; bowel sounds normal; no masses,  no organomegaly Extremities: extremities normal, atraumatic, no cyanosis or edema and Homans sign is negative, no sign of DVT  Lab Results: CBC: Recent Labs  01/13/15 0234 01/14/15 0840  WBC 9.0 7.2  HGB 13.0 13.1  HCT 41.0 41.1  PLT 210 205   BMET:  Recent Labs  01/13/15 0234  NA 143  K 4.3  CL 113*  CO2 25  GLUCOSE 128*  BUN 11  CREATININE 0.70  CALCIUM 9.0    PT/INR:  Recent Labs  01/13/15 0828  LABPROT 13.9  INR 1.05     Radiology Dg Chest 2 View  01/13/2015  CLINICAL DATA:  Midsternal chest pain. Nausea, vomiting, and shortness of breath. EXAM: CHEST  2 VIEW COMPARISON:  03/10/2011 FINDINGS: Heart size and mediastinal contours are unchanged. There is hyperinflation and coarsened interstitial markings, chronic. No superimposed pulmonary edema, consolidation, pleural effusion or pneumothorax. Osseous structures appear intact. Mid anterior wedging of lower thoracic vertebra is unchanged. IMPRESSION: Chronic hyperinflation and coarsened interstitial markings. No superimposed acute process. Electronically Signed   By: Jeb Levering M.D.   On: 01/13/2015 03:11   Lab Results  Component Value Date   TROPONINI 6.14* 01/13/2015    Assessment/Plan: S/P Procedure(s) (LRB): Left Heart Cath and Coronary Angiography (N/A)   Discussed with Dr Tamala Julian, will hold off on CABG at this point, Cardiology to treat with anticoagulaton and follow plan out lined in cath report    Grace Isaac MD 01/14/2015 4:07 PM

## 2015-01-14 NOTE — Progress Notes (Signed)
Paged by nursing staff regarding frequent bradycardia, cath report reviewed, severe RCA disease and chronically occluded mid LAD with R to L collateral of distal LAD from RCA. Patient denies any chest pain since yesterday, her HR improve upon waking up, most of significant bradycardia occuring during sleep. Not current on AV nodal blocking agent. On IV heparin and Aggrastat. She is asymptomatic at this point. Will continue to observe for bradycardia which may be exacerbated by significant RCA disease. Will order PRN atropine to keep at bedside just in case she has severe symptomatic bradycardia.  Hilbert Corrigan PA Pager: [phone]

## 2015-01-14 NOTE — Progress Notes (Signed)
ANTICOAGULATION CONSULT NOTE - Follow Up Consult  Pharmacy Consult for heparin post-sheath removal Indication: NSTEMI; Severe two-vessel CAD with possible surgical revascularization  No Known Allergies Patient Measurements: Height: 5\' 5"  (165.1 cm) Weight: 180 lb (81.647 kg) IBW/kg (Calculated) : 57 Heparin Dosing Weight: 74.4 kg Vital Signs: Temp: 98.9 F (37.2 C) (01/13 1959) Temp Source: Oral (01/13 1959) BP: 129/65 mmHg (01/13 1600) Pulse Rate: 70 (01/13 1600) Labs:  Recent Labs  01/13/15 0234 01/13/15 0740 01/13/15 0828 01/13/15 1558 01/13/15 2153 01/14/15 0840 01/14/15 1934  HGB 13.0  --   --   --   --  13.1  --   HCT 41.0  --   --   --   --  41.1  --   PLT 210  --   --   --   --  205  --   LABPROT  --   --  13.9  --   --   --   --   INR  --   --  1.05  --   --   --   --   HEPARINUNFRC  --   --   --   --   --  0.14* 0.16*  CREATININE 0.70  --   --   --   --   --   --   TROPONINI  --  5.46*  --  5.47* 6.14*  --   --    Estimated Creatinine Clearance: 83.8 mL/min (by C-G formula based on Cr of 0.7).  Assessment: 55 year old female admitted for ACS now s/p cardiac cath found to have severe two-vessel CAD restarted heparin after cath, also continuing Aggrastat. Heparin level = 0.16, subtherapeutic on 1150 units/hr.   Goal of Therapy:  Heparin level 0.3 to 0.5 units/ml while on Aggrastat  Monitor platelets by anticoagulation protocol: Yes   Plan:  Increase IV Heparin to 1350 units/hr. F/u AM heparin level Daily heparin level and CBC while on therapy.  Monitor for signs and symptoms of bleeding.   Maryanna Shape, PharmD, BCPS  Clinical Pharmacist  Pager: [phone]   01/14/2015 8:57 PM

## 2015-01-14 NOTE — Progress Notes (Signed)
Pt. Has heart rate sustained in upper 40s lower 50s since about 0300.  Lowest HR at 43. Pt. Is asymptomatic and HR goes into the 60s when patient is awake.  Pt. Vitals are stable.  Meng, [place] made aware with instructions to put on pacing pads if HR continues to drop or patient becomes symptomatic.

## 2015-01-15 ENCOUNTER — Other Ambulatory Visit (HOSPITAL_COMMUNITY): Payer: Self-pay

## 2015-01-15 LAB — CBC
HCT: 41.4 % (ref 36.0–46.0)
Hemoglobin: 13.1 g/dL (ref 12.0–15.0)
MCH: 30 pg (ref 26.0–34.0)
MCHC: 31.6 g/dL (ref 30.0–36.0)
MCV: 94.7 fL (ref 78.0–100.0)
PLATELETS: 198 10*3/uL (ref 150–400)
RBC: 4.37 MIL/uL (ref 3.87–5.11)
RDW: 13 % (ref 11.5–15.5)
WBC: 6.9 10*3/uL (ref 4.0–10.5)

## 2015-01-15 LAB — HEPARIN LEVEL (UNFRACTIONATED)
HEPARIN UNFRACTIONATED: 0.32 [IU]/mL (ref 0.30–0.70)
Heparin Unfractionated: 0.35 IU/mL (ref 0.30–0.70)

## 2015-01-15 NOTE — Progress Notes (Signed)
ANTICOAGULATION CONSULT NOTE - Follow Up Consult  Pharmacy Consult for heparin Indication: CAD  Labs:  Recent Labs  01/13/15 0234 01/13/15 0740 01/13/15 0828 01/13/15 1558 01/13/15 2153 01/14/15 0840 01/14/15 1934 01/15/15 0243  HGB 13.0  --   --   --   --  13.1  --  13.1  HCT 41.0  --   --   --   --  41.1  --  41.4  PLT 210  --   --   --   --  205  --  198  LABPROT  --   --  13.9  --   --   --   --   --   INR  --   --  1.05  --   --   --   --   --   HEPARINUNFRC  --   --   --   --   --  0.14* 0.16* 0.32  CREATININE 0.70  --   --   --   --   --   --   --   TROPONINI  --  5.46*  --  5.47* 6.14*  --   --   --      Assessment/Plan:  55yo female therapeutic on heparin after rate increase, currently on Aggrastat. Will continue gtt at current rate and confirm stable with additional level.   Wynona Neat, PharmD, BCPS  01/15/2015,4:12 AM

## 2015-01-15 NOTE — Progress Notes (Signed)
ANTICOAGULATION CONSULT NOTE - Follow Up Consult  Pharmacy Consult for heparin post-sheath removal Indication: NSTEMI; Severe two-vessel CAD with possible surgical revascularization  No Known Allergies Patient Measurements: Height: 5\' 5"  (165.1 cm) Weight: 141 lb 8.6 oz (64.2 kg) IBW/kg (Calculated) : 57 Heparin Dosing Weight: 74.4 kg Vital Signs: Temp: 98 F (36.7 C) (01/14 1115) Temp Source: Oral (01/14 1115) BP: 130/71 mmHg (01/14 1200) Pulse Rate: 51 (01/14 1200) Labs:  Recent Labs  01/13/15 0234 01/13/15 0740 01/13/15 0828 01/13/15 1558 01/13/15 2153  01/14/15 0840 01/14/15 1934 01/15/15 0243 01/15/15 1013  HGB 13.0  --   --   --   --   --  13.1  --  13.1  --   HCT 41.0  --   --   --   --   --  41.1  --  41.4  --   PLT 210  --   --   --   --   --  205  --  198  --   LABPROT  --   --  13.9  --   --   --   --   --   --   --   INR  --   --  1.05  --   --   --   --   --   --   --   HEPARINUNFRC  --   --   --   --   --   < > 0.14* 0.16* 0.32 0.35  CREATININE 0.70  --   --   --   --   --   --   --   --   --   TROPONINI  --  5.46*  --  5.47* 6.14*  --   --   --   --   --   < > = values in this interval not displayed. Estimated Creatinine Clearance: 71.5 mL/min (by C-G formula based on Cr of 0.7).  Assessment: 55 year old female admitted for ACS now s/p cardiac cath found to have severe two-vessel CAD with large clot burden. Dr. Servando Snare stating CABG not a good first option given probability of non-graftable LAD. Plan is for 3-4 days of anticoagulation, then repeat cath.    HL 0.35 on 1350 units/hr, CBC stable. No bleeding reported. She continues on heparin and Aggrastat for now pending repeat cath.  Goal of Therapy:  Heparin level 0.3 to 0.5 units/ml while on Aggrastat  Monitor platelets by anticoagulation protocol: Yes   Plan:  - Continue Heparin at 1350 units/hr. - Heparin level in AM - Daily heparin level and CBC while on therapy.  - Monitor for signs and  symptoms of bleeding.  - F/U cath on 1/16  Governor Specking, PharmD Clinical Pharmacy Resident Pager: [phone]  01/15/2015 3:22 PM

## 2015-01-15 NOTE — Progress Notes (Signed)
Subjective:  POD # 2 Cath/ NSTEMI, No CP/SOB  Objective:  Temp:  [97.9 F (36.6 C)-98.9 F (37.2 C)] 97.9 F (36.6 C) (01/14 0800) Pulse Rate:  [43-70] 53 (01/14 0800) Resp:  [16] 16 (01/14 0400) BP: (92-129)/(50-76) 114/63 mmHg (01/14 0800) SpO2:  [92 %-99 %] 98 % (01/14 0800) Weight:  [141 lb 8.6 oz (64.2 kg)] 141 lb 8.6 oz (64.2 kg) (01/14 0622) Weight change:   Intake/Output from previous day: 01/13 0701 - 01/14 0700 In: 1934.3 [P.O.:960; I.V.:974.3] Out: -   Intake/Output from this shift:    Physical Exam: General appearance: alert and no distress Neck: no adenopathy, no carotid bruit, no JVD, supple, symmetrical, trachea midline and thyroid not enlarged, symmetric, no tenderness/mass/nodules Lungs: clear to auscultation bilaterally Heart: regular rate and rhythm, S1, S2 normal, no murmur, click, rub or gallop Extremities: Right radial puncture site OK  Lab Results: Results for orders placed or performed during the hospital encounter of 01/13/15 (from the past 48 hour(s))  Troponin I     Status: Abnormal   Collection Time: 01/13/15  3:58 PM  Result Value Ref Range   Troponin I 5.47 (HH) <0.031 ng/mL    Comment:        POSSIBLE MYOCARDIAL ISCHEMIA. SERIAL TESTING RECOMMENDED. CRITICAL VALUE NOTED.  VALUE IS CONSISTENT WITH PREVIOUSLY REPORTED AND CALLED VALUE.   Troponin I     Status: Abnormal   Collection Time: 01/13/15  9:53 PM  Result Value Ref Range   Troponin I 6.14 (HH) <0.031 ng/mL    Comment:        POSSIBLE MYOCARDIAL ISCHEMIA. SERIAL TESTING RECOMMENDED. CRITICAL VALUE NOTED.  VALUE IS CONSISTENT WITH PREVIOUSLY REPORTED AND CALLED VALUE.   CBC     Status: None   Collection Time: 01/14/15  8:40 AM  Result Value Ref Range   WBC 7.2 4.0 - 10.5 K/uL   RBC 4.34 3.87 - 5.11 MIL/uL   Hemoglobin 13.1 12.0 - 15.0 g/dL   HCT 41.1 36.0 - 46.0 %   MCV 94.7 78.0 - 100.0 fL   MCH 30.2 26.0 - 34.0 pg   MCHC 31.9 30.0 - 36.0 g/dL   RDW 13.0 11.5 -  15.5 %   Platelets 205 150 - 400 K/uL  Heparin level (unfractionated)     Status: Abnormal   Collection Time: 01/14/15  8:40 AM  Result Value Ref Range   Heparin Unfractionated 0.14 (L) 0.30 - 0.70 IU/mL    Comment:        IF HEPARIN RESULTS ARE BELOW EXPECTED VALUES, AND PATIENT DOSAGE HAS BEEN CONFIRMED, SUGGEST FOLLOW UP TESTING OF ANTITHROMBIN III LEVELS.   Heparin level (unfractionated)     Status: Abnormal   Collection Time: 01/14/15  7:34 PM  Result Value Ref Range   Heparin Unfractionated 0.16 (L) 0.30 - 0.70 IU/mL    Comment:        IF HEPARIN RESULTS ARE BELOW EXPECTED VALUES, AND PATIENT DOSAGE HAS BEEN CONFIRMED, SUGGEST FOLLOW UP TESTING OF ANTITHROMBIN III LEVELS.   CBC     Status: None   Collection Time: 01/15/15  2:43 AM  Result Value Ref Range   WBC 6.9 4.0 - 10.5 K/uL   RBC 4.37 3.87 - 5.11 MIL/uL   Hemoglobin 13.1 12.0 - 15.0 g/dL   HCT 41.4 36.0 - 46.0 %   MCV 94.7 78.0 - 100.0 fL   MCH 30.0 26.0 - 34.0 pg   MCHC 31.6 30.0 - 36.0 g/dL  RDW 13.0 11.5 - 15.5 %   Platelets 198 150 - 400 K/uL  Heparin level (unfractionated)     Status: None   Collection Time: 01/15/15  2:43 AM  Result Value Ref Range   Heparin Unfractionated 0.32 0.30 - 0.70 IU/mL    Comment:        IF HEPARIN RESULTS ARE BELOW EXPECTED VALUES, AND PATIENT DOSAGE HAS BEEN CONFIRMED, SUGGEST FOLLOW UP TESTING OF ANTITHROMBIN III LEVELS.     Imaging: Imaging results have been reviewed  Tele- NSR/SB  Assessment/Plan:   1. Active Problems: 2.   NSTEMI (non-ST elevated myocardial infarction) ([place]) 3.   Time Spent Directly with Patient:  20 minutes  Length of Stay:  LOS: 2 days   POD # 2 NSTEMI, radial cath (HS), revealing a thrombotically occluded dom RCA, LAD CTO with nl LV FXN. Given large thrombus burden pt put on Aggrastat and Hep. No CP/SOB. Peak trop 6. Dr Servando Snare saw and feels that CABG not a good first option given prob non graftable LAD. Plan was 3-4 days  anticoag then relook cath +/- PCI. Will put on cath board for Monday for relook. Cont Hep/Aggrastat. Plts OK. Other labs OK.   Kathy Mclean 01/15/2015, 9:29 AM

## 2015-01-16 ENCOUNTER — Inpatient Hospital Stay (HOSPITAL_COMMUNITY): Payer: PRIVATE HEALTH INSURANCE

## 2015-01-16 DIAGNOSIS — I2510 Atherosclerotic heart disease of native coronary artery without angina pectoris: Secondary | ICD-10-CM

## 2015-01-16 LAB — CBC
HCT: 43.6 % (ref 36.0–46.0)
Hemoglobin: 14 g/dL (ref 12.0–15.0)
MCH: 30.4 pg (ref 26.0–34.0)
MCHC: 32.1 g/dL (ref 30.0–36.0)
MCV: 94.6 fL (ref 78.0–100.0)
PLATELETS: 207 10*3/uL (ref 150–400)
RBC: 4.61 MIL/uL (ref 3.87–5.11)
RDW: 12.9 % (ref 11.5–15.5)
WBC: 8.1 10*3/uL (ref 4.0–10.5)

## 2015-01-16 LAB — HEPARIN LEVEL (UNFRACTIONATED): HEPARIN UNFRACTIONATED: 0.3 [IU]/mL (ref 0.30–0.70)

## 2015-01-16 MED ORDER — SODIUM CHLORIDE 0.9 % IJ SOLN
3.0000 mL | Freq: Two times a day (BID) | INTRAMUSCULAR | Status: DC
Start: 2015-01-16 — End: 2015-01-17
  Administered 2015-01-17: 3 mL via INTRAVENOUS

## 2015-01-16 MED ORDER — SODIUM CHLORIDE 0.9 % IJ SOLN: 3.0000 mL | INTRAMUSCULAR | Status: DC | PRN

## 2015-01-16 MED ORDER — ASPIRIN 81 MG PO CHEW
81.0000 mg | CHEWABLE_TABLET | ORAL | Status: AC
  Administered 2015-01-17: 81 mg via ORAL
  Filled 2015-01-16: qty 1

## 2015-01-16 MED ORDER — SODIUM CHLORIDE 0.9 % WEIGHT BASED INFUSION
1.0000 mL/kg/h | INTRAVENOUS | Status: DC
  Administered 2015-01-16 – 2015-01-17 (×2): 1 mL/kg/h via INTRAVENOUS
  Administered 2015-01-17: 500 mL via INTRAVENOUS

## 2015-01-16 MED ORDER — SODIUM CHLORIDE 0.9 % IV SOLN: 250.0000 mL | INTRAVENOUS | Status: DC | PRN

## 2015-01-16 NOTE — Progress Notes (Signed)
Per pt family, pt has infection in her mouth. Pt has screws and caps on some upper teeth. Per Pt "when I press upper lip, I can taste infection in my mouth." MD paged and made aware. No new orders received.

## 2015-01-16 NOTE — Progress Notes (Signed)
Pre-op Cardiac Surgery  Carotid Findings:  1-39% ICA plaquing. Vertebral artery flow is antegrade.   Upper Extremity Right Left  Brachial Pressures 136T 127T  Radial Waveforms T T  Ulnar Waveforms T T  Palmar Arch (Allen's Test) WNL WNL   Findings:      Lower  Extremity Right Left  Dorsalis Pedis    Anterior Tibial T T  Posterior Tibial T T  Ankle/Brachial Indices      Findings:  Palpable and Triphasic

## 2015-01-16 NOTE — Progress Notes (Signed)
ANTICOAGULATION CONSULT NOTE - Follow Up Consult  Pharmacy Consult for heparin post-sheath removal Indication: NSTEMI; Severe two-vessel CAD with possible surgical revascularization  No Known Allergies Patient Measurements: Height: 5\' 5"  (165.1 cm) Weight: 141 lb 8.6 oz (64.2 kg) IBW/kg (Calculated) : 57 Heparin Dosing Weight: 74.4 kg Vital Signs: Temp: 98.3 F (36.8 C) (01/15 0406) Temp Source: Oral (01/15 0406) BP: 122/63 mmHg (01/15 0406) Pulse Rate: 45 (01/15 0406) Labs:  Recent Labs  01/13/15 0740 01/13/15 0828 01/13/15 1558 01/13/15 2153 01/14/15 0840  01/15/15 0243 01/15/15 1013 01/16/15 0334  HGB  --   --   --   --  13.1  --  13.1  --   --   HCT  --   --   --   --  41.1  --  41.4  --   --   PLT  --   --   --   --  205  --  198  --   --   LABPROT  --  13.9  --   --   --   --   --   --   --   INR  --  1.05  --   --   --   --   --   --   --   HEPARINUNFRC  --   --   --   --  0.14*  < > 0.32 0.35 0.30  TROPONINI 5.46*  --  5.47* 6.14*  --   --   --   --   --   < > = values in this interval not displayed. Estimated Creatinine Clearance: 71.5 mL/min (by C-G formula based on Cr of 0.7).  Assessment: 55 year old female admitted for ACS now s/p cardiac cath found to have severe two-vessel CAD with large clot burden. Dr. Servando Snare stating CABG not a good first option given probability of non-graftable LAD. Plan is for 3-4 days of anticoagulation, then repeat cath.    HL remains therapeutic (0.3) 1350 units/hr, CBC stable. No bleeding reported. She continues on Aggrastat pending repeat cath.  Goal of Therapy:  Heparin level 0.3 to 0.5 units/ml while on Aggrastat  Monitor platelets by anticoagulation protocol: Yes   Plan:  - Continue Heparin at 1350 units/hr - Daily heparin level and CBC while on therapy.  - Monitor for signs and symptoms of bleeding.  - F/U cath on 1/16  Sherlon Handing, PharmD, BCPS Clinical pharmacist, pager [phone]  01/16/2015 4:52 AM

## 2015-01-16 NOTE — Progress Notes (Signed)
Subjective:  No CP/SOB. On IV hep and aggrastat. For re look cath tomorrow  Objective:  Temp:  [97.8 F (36.6 C)-98.7 F (37.1 C)] 97.8 F (36.6 C) (01/15 0750) Pulse Rate:  [45-65] 45 (01/15 0406) Resp:  [16] 16 (01/15 0750) BP: (107-135)/(57-74) 117/67 mmHg (01/15 0750) SpO2:  [94 %-100 %] 100 % (01/15 0750) Weight change:   Intake/Output from previous day: 01/14 0701 - 01/15 0700 In: 1812.5 [P.O.:1120; I.V.:692.5] Out: -   Intake/Output from this shift:    Physical Exam: General appearance: alert and no distress Neck: no adenopathy, no carotid bruit, no JVD, supple, symmetrical, trachea midline and thyroid not enlarged, symmetric, no tenderness/mass/nodules Lungs: clear to auscultation bilaterally Heart: regular rate and rhythm, S1, S2 normal, no murmur, click, rub or gallop Extremities: extremities normal, atraumatic, no cyanosis or edema  Lab Results: Results for orders placed or performed during the hospital encounter of 01/13/15 (from the past 48 hour(s))  CBC     Status: None   Collection Time: 01/14/15  8:40 AM  Result Value Ref Range   WBC 7.2 4.0 - 10.5 K/uL   RBC 4.34 3.87 - 5.11 MIL/uL   Hemoglobin 13.1 12.0 - 15.0 g/dL   HCT 41.1 36.0 - 46.0 %   MCV 94.7 78.0 - 100.0 fL   MCH 30.2 26.0 - 34.0 pg   MCHC 31.9 30.0 - 36.0 g/dL   RDW 13.0 11.5 - 15.5 %   Platelets 205 150 - 400 K/uL  Heparin level (unfractionated)     Status: Abnormal   Collection Time: 01/14/15  8:40 AM  Result Value Ref Range   Heparin Unfractionated 0.14 (L) 0.30 - 0.70 IU/mL    Comment:        IF HEPARIN RESULTS ARE BELOW EXPECTED VALUES, AND PATIENT DOSAGE HAS BEEN CONFIRMED, SUGGEST FOLLOW UP TESTING OF ANTITHROMBIN III LEVELS.   Heparin level (unfractionated)     Status: Abnormal   Collection Time: 01/14/15  7:34 PM  Result Value Ref Range   Heparin Unfractionated 0.16 (L) 0.30 - 0.70 IU/mL    Comment:        IF HEPARIN RESULTS ARE BELOW EXPECTED VALUES, AND  PATIENT DOSAGE HAS BEEN CONFIRMED, SUGGEST FOLLOW UP TESTING OF ANTITHROMBIN III LEVELS.   CBC     Status: None   Collection Time: 01/15/15  2:43 AM  Result Value Ref Range   WBC 6.9 4.0 - 10.5 K/uL   RBC 4.37 3.87 - 5.11 MIL/uL   Hemoglobin 13.1 12.0 - 15.0 g/dL   HCT 41.4 36.0 - 46.0 %   MCV 94.7 78.0 - 100.0 fL   MCH 30.0 26.0 - 34.0 pg   MCHC 31.6 30.0 - 36.0 g/dL   RDW 13.0 11.5 - 15.5 %   Platelets 198 150 - 400 K/uL  Heparin level (unfractionated)     Status: None   Collection Time: 01/15/15  2:43 AM  Result Value Ref Range   Heparin Unfractionated 0.32 0.30 - 0.70 IU/mL    Comment:        IF HEPARIN RESULTS ARE BELOW EXPECTED VALUES, AND PATIENT DOSAGE HAS BEEN CONFIRMED, SUGGEST FOLLOW UP TESTING OF ANTITHROMBIN III LEVELS.   Heparin level (unfractionated)     Status: None   Collection Time: 01/15/15 10:13 AM  Result Value Ref Range   Heparin Unfractionated 0.35 0.30 - 0.70 IU/mL    Comment:        IF HEPARIN RESULTS ARE BELOW EXPECTED VALUES, AND PATIENT  DOSAGE HAS BEEN CONFIRMED, SUGGEST FOLLOW UP TESTING OF ANTITHROMBIN III LEVELS.   CBC     Status: None   Collection Time: 01/16/15  3:34 AM  Result Value Ref Range   WBC 8.1 4.0 - 10.5 K/uL   RBC 4.61 3.87 - 5.11 MIL/uL   Hemoglobin 14.0 12.0 - 15.0 g/dL   HCT 43.6 36.0 - 46.0 %   MCV 94.6 78.0 - 100.0 fL   MCH 30.4 26.0 - 34.0 pg   MCHC 32.1 30.0 - 36.0 g/dL   RDW 12.9 11.5 - 15.5 %   Platelets 207 150 - 400 K/uL  Heparin level (unfractionated)     Status: None   Collection Time: 01/16/15  3:34 AM  Result Value Ref Range   Heparin Unfractionated 0.30 0.30 - 0.70 IU/mL    Comment:        IF HEPARIN RESULTS ARE BELOW EXPECTED VALUES, AND PATIENT DOSAGE HAS BEEN CONFIRMED, SUGGEST FOLLOW UP TESTING OF ANTITHROMBIN III LEVELS.     Imaging: Imaging results have been reviewed  Tele- NSR  Assessment/Plan:   1. Active Problems: 2.   NSTEMI (non-ST elevated myocardial infarction)  ([place]) 3.   Time Spent Directly with Patient:  15 minutes  Length of Stay:  LOS: 3 days   On Aggrastat and IV hep for large thrombus burden Dom RCA in setting of NSTEMI. No CP over night. Peak trop 6. Nl EF. Labs OK. Exam benign.  For re look cath tomorrow.  Quay Burow 01/16/2015, 8:01 AM

## 2015-01-16 NOTE — Progress Notes (Signed)
Patient sinus bradycardic in the 40s overnight while asleep, SBP in 120s. When patient fully awake, HR 60-70.

## 2015-01-17 ENCOUNTER — Encounter (HOSPITAL_COMMUNITY): Payer: Self-pay | Admitting: Anesthesiology

## 2015-01-17 ENCOUNTER — Encounter (HOSPITAL_COMMUNITY)
Admission: EM | Disposition: A | Discharge status: Home / Self Care | Payer: Self-pay | Source: Home / Self Care | Attending: Cardiology

## 2015-01-17 HISTORY — PX: CARDIAC CATHETERIZATION: SHX172

## 2015-01-17 LAB — POCT ACTIVATED CLOTTING TIME: ACTIVATED CLOTTING TIME: 348 s

## 2015-01-17 LAB — BASIC METABOLIC PANEL
Anion gap: 7 (ref 5–15)
BUN: 7 mg/dL (ref 6–20)
CALCIUM: 8.9 mg/dL (ref 8.9–10.3)
CHLORIDE: 112 mmol/L — AB (ref 101–111)
CO2: 25 mmol/L (ref 22–32)
CREATININE: 0.76 mg/dL (ref 0.44–1.00)
Glucose, Bld: 101 mg/dL — ABNORMAL HIGH (ref 65–99)
Potassium: 4.2 mmol/L (ref 3.5–5.1)
SODIUM: 144 mmol/L (ref 135–145)

## 2015-01-17 LAB — CBC
HCT: 41.5 % (ref 36.0–46.0)
Hemoglobin: 13.3 g/dL (ref 12.0–15.0)
MCH: 29.8 pg (ref 26.0–34.0)
MCHC: 32 g/dL (ref 30.0–36.0)
MCV: 93 fL (ref 78.0–100.0)
PLATELETS: 205 10*3/uL (ref 150–400)
RBC: 4.46 MIL/uL (ref 3.87–5.11)
RDW: 12.8 % (ref 11.5–15.5)
WBC: 8.2 10*3/uL (ref 4.0–10.5)

## 2015-01-17 LAB — HEPARIN LEVEL (UNFRACTIONATED): HEPARIN UNFRACTIONATED: 0.34 [IU]/mL (ref 0.30–0.70)

## 2015-01-17 SURGERY — CORONARY STENT INTERVENTION
Anesthesia: LOCAL

## 2015-01-17 MED ORDER — TICAGRELOR 90 MG PO TABS
90.0000 mg | ORAL_TABLET | Freq: Two times a day (BID) | ORAL | Status: DC
  Administered 2015-01-18: 90 mg via ORAL
  Filled 2015-01-17: qty 1

## 2015-01-17 MED ORDER — BIVALIRUDIN 250 MG IV SOLR
INTRAVENOUS | Status: AC
  Filled 2015-01-17: qty 250

## 2015-01-17 MED ORDER — SODIUM CHLORIDE 0.9 % IV SOLN
0.1500 mg/kg/h | INTRAVENOUS | Status: AC
  Administered 2015-01-17: 0.15 mg/kg/h via INTRAVENOUS
  Filled 2015-01-17 (×2): qty 250

## 2015-01-17 MED ORDER — SODIUM CHLORIDE 0.9 % IJ SOLN: 3.0000 mL | INTRAMUSCULAR | Status: DC | PRN

## 2015-01-17 MED ORDER — MIDAZOLAM HCL 2 MG/2ML IJ SOLN
INTRAMUSCULAR | Status: DC | PRN
  Administered 2015-01-17: 1 mg via INTRAVENOUS

## 2015-01-17 MED ORDER — SODIUM CHLORIDE 0.9 % WEIGHT BASED INFUSION
3.0000 mL/kg/h | INTRAVENOUS | Status: AC
  Administered 2015-01-17: 3 mL/kg/h via INTRAVENOUS

## 2015-01-17 MED ORDER — FENTANYL CITRATE (PF) 100 MCG/2ML IJ SOLN
INTRAMUSCULAR | Status: DC | PRN
  Administered 2015-01-17: 25 ug via INTRAVENOUS

## 2015-01-17 MED ORDER — ASPIRIN 81 MG PO CHEW
81.0000 mg | CHEWABLE_TABLET | Freq: Every day | ORAL | Status: DC
  Administered 2015-01-18: 81 mg via ORAL
  Filled 2015-01-17: qty 1

## 2015-01-17 MED ORDER — NITROGLYCERIN 1 MG/10 ML FOR IR/CATH LAB
INTRA_ARTERIAL | Status: AC
  Filled 2015-01-17: qty 10

## 2015-01-17 MED ORDER — LIDOCAINE HCL (PF) 1 % IJ SOLN
INTRAMUSCULAR | Status: DC | PRN
  Administered 2015-01-17: 17:00:00

## 2015-01-17 MED ORDER — SODIUM CHLORIDE 0.9 % IV SOLN: 250.0000 mL | INTRAVENOUS | Status: DC | PRN

## 2015-01-17 MED ORDER — ACETAMINOPHEN 325 MG PO TABS: 650.0000 mg | ORAL_TABLET | ORAL | Status: DC | PRN

## 2015-01-17 MED ORDER — FENTANYL CITRATE (PF) 100 MCG/2ML IJ SOLN
INTRAMUSCULAR | Status: AC
  Filled 2015-01-17: qty 2

## 2015-01-17 MED ORDER — BIVALIRUDIN BOLUS VIA INFUSION - CUPID
INTRAVENOUS | Status: DC | PRN
  Administered 2015-01-17: 48.15 mg via INTRAVENOUS

## 2015-01-17 MED ORDER — TICAGRELOR 90 MG PO TABS
ORAL_TABLET | ORAL | Status: DC | PRN
  Administered 2015-01-17: 180 mg via ORAL

## 2015-01-17 MED ORDER — HYDRALAZINE HCL 20 MG/ML IJ SOLN
10.0000 mg | INTRAMUSCULAR | Status: DC | PRN
  Filled 2015-01-17: qty 1

## 2015-01-17 MED ORDER — SODIUM CHLORIDE 0.9 % IV SOLN
250.0000 mg | INTRAVENOUS | Status: DC | PRN
  Administered 2015-01-17: 1.75 mg/kg/h via INTRAVENOUS

## 2015-01-17 MED ORDER — HEPARIN SODIUM (PORCINE) 1000 UNIT/ML IJ SOLN
INTRAMUSCULAR | Status: AC
  Filled 2015-01-17: qty 1

## 2015-01-17 MED ORDER — LIDOCAINE HCL (PF) 1 % IJ SOLN
INTRAMUSCULAR | Status: AC
  Filled 2015-01-17: qty 30

## 2015-01-17 MED ORDER — SODIUM CHLORIDE 0.9 % IJ SOLN
3.0000 mL | Freq: Two times a day (BID) | INTRAMUSCULAR | Status: DC
  Administered 2015-01-17 – 2015-01-18 (×2): 3 mL via INTRAVENOUS

## 2015-01-17 MED ORDER — NITROGLYCERIN 1 MG/10 ML FOR IR/CATH LAB
INTRA_ARTERIAL | Status: DC | PRN
  Administered 2015-01-17: 200 ug via INTRACORONARY

## 2015-01-17 MED ORDER — IOHEXOL 350 MG/ML SOLN
INTRAVENOUS | Status: DC | PRN
  Administered 2015-01-17: 100 mL via INTRA_ARTERIAL

## 2015-01-17 MED ORDER — ONDANSETRON HCL 4 MG/2ML IJ SOLN: 4.0000 mg | Freq: Four times a day (QID) | INTRAMUSCULAR | Status: DC | PRN

## 2015-01-17 MED ORDER — MIDAZOLAM HCL 2 MG/2ML IJ SOLN
INTRAMUSCULAR | Status: AC
  Filled 2015-01-17: qty 2

## 2015-01-17 MED ORDER — HEPARIN (PORCINE) IN NACL 2-0.9 UNIT/ML-% IJ SOLN
INTRAMUSCULAR | Status: AC
  Filled 2015-01-17: qty 2000

## 2015-01-17 MED ORDER — MORPHINE SULFATE (PF) 2 MG/ML IV SOLN
2.0000 mg | INTRAVENOUS | Status: DC | PRN
  Administered 2015-01-18: 2 mg via INTRAVENOUS
  Filled 2015-01-17: qty 1

## 2015-01-17 SURGICAL SUPPLY — 14 items
BALLN EMERGE MR 2.5X15 (BALLOONS) ×2
BALLOON EMERGE MR 2.5X15 (BALLOONS) ×1 IMPLANT
CATH ANGIOJET SPIRO ULTR 135CM (CATHETERS) ×2 IMPLANT
CATH S G BIP PACING (SET/KITS/TRAYS/PACK) ×2 IMPLANT
CATH VISTA GUIDE 6FR JR4 (CATHETERS) ×2 IMPLANT
KIT ENCORE 26 ADVANTAGE (KITS) ×4 IMPLANT
KIT HEART LEFT (KITS) ×2 IMPLANT
PACK CARDIAC CATHETERIZATION (CUSTOM PROCEDURE TRAY) ×2 IMPLANT
SHEATH PINNACLE 6F 10CM (SHEATH) ×4 IMPLANT
STENT SYNERGY DES 4X28 (Permanent Stent) ×2 IMPLANT
TRANSDUCER W/STOPCOCK (MISCELLANEOUS) ×2 IMPLANT
TUBING CIL FLEX 10 FLL-RA (TUBING) ×4 IMPLANT
WIRE ASAHI PROWATER 180CM (WIRE) ×2 IMPLANT
WIRE EMERALD 3MM-J .035X150CM (WIRE) ×2 IMPLANT

## 2015-01-17 NOTE — Progress Notes (Signed)
CARDIAC REHAB PHASE I   Per pt RN, pt has been ambulating in the room with no complaints. Offered to walk with pt, pt seems anxious, states "not right now, I just need to get this over with first" (referring to today's cath). Pt in bed, call bell within reach. Will continue to follow.   Lenna Sciara, RN, BSN 01/17/2015 11:20 AM

## 2015-01-17 NOTE — Interval H&P Note (Signed)
Cath Lab Visit (complete for each Cath Lab visit)  Clinical Evaluation Leading to the Procedure:   ACS: Yes.    Non-ACS:    Anginal Classification: CCS IV  Anti-ischemic medical therapy: No Therapy  Non-Invasive Test Results: No non-invasive testing performed  Prior CABG: No previous CABG      History and Physical Interval Note:  01/17/2015 3:59 PM  Kathy Mclean  has presented today for surgery, with the diagnosis of CAD  The various methods of treatment have been discussed with the patient and family. After consideration of risks, benefits and other options for treatment, the patient has consented to  Procedure(s): Coronary Stent Intervention (N/A) as a surgical intervention .  The patient's history has been reviewed, patient examined, no change in status, stable for surgery.  I have reviewed the patient's chart and labs.  Questions were answered to the patient's satisfaction.     Quay Burow

## 2015-01-17 NOTE — Progress Notes (Addendum)
   Subjective: Pt comfortable  Denies CP  No SOB  Objective: Filed Vitals:   01/16/15 1946 01/16/15 2306 01/17/15 0320 01/17/15 0754  BP: 112/67 131/79 113/68 136/87  Pulse:    48  Temp: 98.5 F (36.9 C) 98.5 F (36.9 C)  98 F (36.7 C)  TempSrc: Oral Oral  Oral  Resp:    16  Height:      Weight:      SpO2: 97% 98%  95%   Weight change:   Intake/Output Summary (Last 24 hours) at 01/17/15 0948 Last data filed at 01/17/15 0900  Gross per 24 hour  Intake 2539.8 ml  Output      0 ml  Net 2539.8 ml    General: Alert, awake, oriented x3, in no acute distress Neck:  JVP is normal Heart: Regular rate and rhythm, without murmurs, rubs, gallops.  Lungs: Clear to auscultation.  No rales or wheezes. Exemities:  No edema.   Neuro: Grossly intact, nonfocal.  Tele:  SB  40s to 50s   Lab Results: Results for orders placed or performed during the hospital encounter of 01/13/15 (from the past 24 hour(s))  CBC     Status: None   Collection Time: 01/17/15  2:49 AM  Result Value Ref Range   WBC 8.2 4.0 - 10.5 K/uL   RBC 4.46 3.87 - 5.11 MIL/uL   Hemoglobin 13.3 12.0 - 15.0 g/dL   HCT 41.5 36.0 - 46.0 %   MCV 93.0 78.0 - 100.0 fL   MCH 29.8 26.0 - 34.0 pg   MCHC 32.0 30.0 - 36.0 g/dL   RDW 12.8 11.5 - 15.5 %   Platelets 205 150 - 400 K/uL  Heparin level (unfractionated)     Status: None   Collection Time: 01/17/15  2:49 AM  Result Value Ref Range   Heparin Unfractionated 0.34 0.30 - 0.70 IU/mL  Basic metabolic panel     Status: Abnormal   Collection Time: 01/17/15  2:49 AM  Result Value Ref Range   Sodium 144 135 - 145 mmol/L   Potassium 4.2 3.5 - 5.1 mmol/L   Chloride 112 (H) 101 - 111 mmol/L   CO2 25 22 - 32 mmol/L   Glucose, Bld 101 (H) 65 - 99 mg/dL   BUN 7 6 - 20 mg/dL   Creatinine, Ser 0.76 0.44 - 1.00 mg/dL   Calcium 8.9 8.9 - 10.3 mg/dL   GFR calc non Af Amer >60 >60 mL/min   GFR calc Af Amer >60 >60 mL/min   Anion gap 7 5 - 15    Studies/Results: No results  found.  Medications:   @PROBHOSP @  1  NSTEMI    Plan for cath today    Continue Heparin and Aggrastat   LOS: 4 days   Dorris Carnes 01/17/2015, 9:48 AM

## 2015-01-17 NOTE — H&P (View-Only) (Signed)
   Subjective: Pt comfortable  Denies CP  No SOB  Objective: Filed Vitals:   01/16/15 1946 01/16/15 2306 01/17/15 0320 01/17/15 0754  BP: 112/67 131/79 113/68 136/87  Pulse:    48  Temp: 98.5 F (36.9 C) 98.5 F (36.9 C)  98 F (36.7 C)  TempSrc: Oral Oral  Oral  Resp:    16  Height:      Weight:      SpO2: 97% 98%  95%   Weight change:   Intake/Output Summary (Last 24 hours) at 01/17/15 0948 Last data filed at 01/17/15 0900  Gross per 24 hour  Intake 2539.8 ml  Output      0 ml  Net 2539.8 ml    General: Alert, awake, oriented x3, in no acute distress Neck:  JVP is normal Heart: Regular rate and rhythm, without murmurs, rubs, gallops.  Lungs: Clear to auscultation.  No rales or wheezes. Exemities:  No edema.   Neuro: Grossly intact, nonfocal.  Tele:  SB  40s to 50s   Lab Results: Results for orders placed or performed during the hospital encounter of 01/13/15 (from the past 24 hour(s))  CBC     Status: None   Collection Time: 01/17/15  2:49 AM  Result Value Ref Range   WBC 8.2 4.0 - 10.5 K/uL   RBC 4.46 3.87 - 5.11 MIL/uL   Hemoglobin 13.3 12.0 - 15.0 g/dL   HCT 41.5 36.0 - 46.0 %   MCV 93.0 78.0 - 100.0 fL   MCH 29.8 26.0 - 34.0 pg   MCHC 32.0 30.0 - 36.0 g/dL   RDW 12.8 11.5 - 15.5 %   Platelets 205 150 - 400 K/uL  Heparin level (unfractionated)     Status: None   Collection Time: 01/17/15  2:49 AM  Result Value Ref Range   Heparin Unfractionated 0.34 0.30 - 0.70 IU/mL  Basic metabolic panel     Status: Abnormal   Collection Time: 01/17/15  2:49 AM  Result Value Ref Range   Sodium 144 135 - 145 mmol/L   Potassium 4.2 3.5 - 5.1 mmol/L   Chloride 112 (H) 101 - 111 mmol/L   CO2 25 22 - 32 mmol/L   Glucose, Bld 101 (H) 65 - 99 mg/dL   BUN 7 6 - 20 mg/dL   Creatinine, Ser 0.76 0.44 - 1.00 mg/dL   Calcium 8.9 8.9 - 10.3 mg/dL   GFR calc non Af Amer >60 >60 mL/min   GFR calc Af Amer >60 >60 mL/min   Anion gap 7 5 - 15    Studies/Results: No results  found.  Medications:   @PROBHOSP @  1  NSTEMI    Plan for cath today    Continue Heparin and Aggrastat   LOS: 4 days   Dorris Carnes 01/17/2015, 9:48 AM

## 2015-01-17 NOTE — Progress Notes (Signed)
ANTICOAGULATION CONSULT NOTE - Follow Up Consult  Pharmacy Consult for heparin post-sheath removal Indication: NSTEMI; Severe two-vessel CAD with possible surgical revascularization  No Known Allergies Patient Measurements: Height: 5\' 5"  (165.1 cm) Weight: 141 lb 8.6 oz (64.2 kg) IBW/kg (Calculated) : 57 Heparin Dosing Weight: 74.4 kg Vital Signs: Temp: 98.5 F (36.9 C) (01/15 2306) Temp Source: Oral (01/15 2306) BP: 131/79 mmHg (01/15 2306) Labs:  Recent Labs  01/15/15 0243 01/15/15 1013 01/16/15 0334 01/17/15 0249  HGB 13.1  --  14.0 13.3  HCT 41.4  --  43.6 41.5  PLT 198  --  207 205  HEPARINUNFRC 0.32 0.35 0.30 0.34   Estimated Creatinine Clearance: 71.5 mL/min (by C-G formula based on Cr of 0.7).  Assessment: 55 year old female admitted for ACS now s/p cardiac cath found to have severe two-vessel CAD with large clot burden. Dr. Servando Snare stating CABG not a good first option given probability of non-graftable LAD. Plan is for 3-4 days of anticoagulation, then repeat cath on 1/16.    HL remains therapeutic (0.34) on 1350 units/hr, CBC stable. No bleeding reported. She continues on Aggrastat pending repeat cath.  Goal of Therapy:  Heparin level 0.3 to 0.5 units/ml while on Aggrastat  Monitor platelets by anticoagulation protocol: Yes   Plan:  - Continue Heparin at 1350 units/hr - Daily heparin level and CBC while on therapy.  - Monitor for signs and symptoms of bleeding.  - F/U cath on 1/16  Sherlon Handing, PharmD, BCPS Clinical pharmacist, pager [phone]  01/17/2015 3:25 AM

## 2015-01-18 ENCOUNTER — Encounter (HOSPITAL_COMMUNITY): Payer: Self-pay | Admitting: Cardiovascular Disease

## 2015-01-18 ENCOUNTER — Encounter (HOSPITAL_COMMUNITY)
Admission: EM | Disposition: A | Discharge status: Home / Self Care | Payer: Self-pay | Source: Home / Self Care | Attending: Cardiology

## 2015-01-18 DIAGNOSIS — Z720 Tobacco use: Secondary | ICD-10-CM | POA: Diagnosis present

## 2015-01-18 DIAGNOSIS — I2510 Atherosclerotic heart disease of native coronary artery without angina pectoris: Secondary | ICD-10-CM | POA: Diagnosis present

## 2015-01-18 DIAGNOSIS — E785 Hyperlipidemia, unspecified: Secondary | ICD-10-CM | POA: Diagnosis present

## 2015-01-18 LAB — CBC
HCT: 40.7 % (ref 36.0–46.0)
HEMOGLOBIN: 12.9 g/dL (ref 12.0–15.0)
MCH: 29.7 pg (ref 26.0–34.0)
MCHC: 31.7 g/dL (ref 30.0–36.0)
MCV: 93.6 fL (ref 78.0–100.0)
PLATELETS: 184 10*3/uL (ref 150–400)
RBC: 4.35 MIL/uL (ref 3.87–5.11)
RDW: 12.9 % (ref 11.5–15.5)
WBC: 6.9 10*3/uL (ref 4.0–10.5)

## 2015-01-18 LAB — BASIC METABOLIC PANEL
Anion gap: 7 (ref 5–15)
BUN: 5 mg/dL — AB (ref 6–20)
CO2: 26 mmol/L (ref 22–32)
Calcium: 9 mg/dL (ref 8.9–10.3)
Chloride: 110 mmol/L (ref 101–111)
Creatinine, Ser: 0.73 mg/dL (ref 0.44–1.00)
GFR calc Af Amer: >60 mL/min (ref >60)
Glucose, Bld: 83 mg/dL (ref 65–99)
POTASSIUM: 3.8 mmol/L (ref 3.5–5.1)
SODIUM: 143 mmol/L (ref 135–145)

## 2015-01-18 SURGERY — CORONARY ARTERY BYPASS GRAFTING (CABG)
Anesthesia: General | Site: Chest

## 2015-01-18 MED ORDER — ATORVASTATIN CALCIUM 80 MG PO TABS: 80.0000 mg | ORAL_TABLET | Freq: Every day | ORAL | Status: DC

## 2015-01-18 MED ORDER — TICAGRELOR 90 MG PO TABS: 90.0000 mg | ORAL_TABLET | Freq: Two times a day (BID) | ORAL | Status: DC

## 2015-01-18 MED ORDER — ASPIRIN 81 MG PO CHEW: 81.0000 mg | CHEWABLE_TABLET | Freq: Every day | ORAL | Status: AC

## 2015-01-18 MED ORDER — NITROGLYCERIN 0.4 MG SL SUBL: 0.4000 mg | SUBLINGUAL_TABLET | SUBLINGUAL | Status: AC | PRN

## 2015-01-18 MED FILL — Heparin Sodium (Porcine) Inj 1000 Unit/ML: INTRAMUSCULAR | Qty: 10 | Status: AC

## 2015-01-18 MED FILL — Lidocaine HCl Local Preservative Free (PF) Inj 1%: INTRAMUSCULAR | Qty: 30 | Status: AC

## 2015-01-18 MED FILL — Heparin Sodium (Porcine) 2 Unit/ML in Sodium Chloride 0.9%: INTRAMUSCULAR | Qty: 500 | Status: AC

## 2015-01-18 MED FILL — Nitroglycerin IV Soln 100 MCG/ML in D5W: INTRA_ARTERIAL | Qty: 10 | Status: AC

## 2015-01-18 NOTE — Progress Notes (Addendum)
CARDIAC REHAB PHASE I   PRE:  Rate/Rhythm: 62 SB  BP:  Supine:   Sitting: 103/61  Standing:    SaO2:   MODE:  Ambulation: 350 ft   POST:  Rate/Rhythm: 72 SR  BP:  Supine:   Sitting: 112/61  Standing:    SaO2:  0910-1030 Pt walked 350 ft with hand held asst. No CP. Tolerated well. MI education completed. Stressed importance of briinta with stent. Has brilinta card. Pt very emotional when talking about stress. Pt has son and boyfriend that are disabled and she is provider for both. Encouraged pt to talk with counselor as she stated she has thought about this. Encouraged her to find medical doctor from listing case manager gave so she would have follow up. Discussed taking care of self as she is so dedicated to everyone else. Discussed CRP 2 and gave financial form for her to fill out. Referral to [place] made. Emotional support given. Discussed smoking cessation and gave fake cigarette and handouts.   Graylon Good, RN BSN  01/18/2015 10:25 AM

## 2015-01-18 NOTE — Progress Notes (Signed)
   Subjective: Feels good  No CP  No SOB   Objective: Filed Vitals:   01/18/15 0500 01/18/15 0600 01/18/15 0700 01/18/15 0817  BP: 122/65 141/68 114/67 125/70  Pulse: 50 51 46   Temp:    98.3 F (36.8 C)  TempSrc:    Axillary  Resp:    16  Height:      Weight:      SpO2: 98% 100% 98% 96%   Weight change:   Intake/Output Summary (Last 24 hours) at 01/18/15 1026 Last data filed at 01/18/15 0600  Gross per 24 hour  Intake 1449.46 ml  Output   1700 ml  Net -250.54 ml    General: Alert, awake, oriented x3, in no acute distress Neck:  JVP is normal Heart: Regular rate and rhythm, without murmurs, rubs, gallops.  Lungs: Clear to auscultation.  No rales or wheezes. Exemities:  No edema.  R groin with dry dressing  No bruit  No hematoma   Neuro: Grossly intact, nonfocal.  Tele  SB/SR    Lab Results: Results for orders placed or performed during the hospital encounter of 01/13/15 (from the past 24 hour(s))  POCT Activated clotting time     Status: None   Collection Time: 01/17/15  4:49 PM  Result Value Ref Range   Activated Clotting Time 348 seconds  Basic metabolic panel     Status: Abnormal   Collection Time: 01/18/15  2:25 AM  Result Value Ref Range   Sodium 143 135 - 145 mmol/L   Potassium 3.8 3.5 - 5.1 mmol/L   Chloride 110 101 - 111 mmol/L   CO2 26 22 - 32 mmol/L   Glucose, Bld 83 65 - 99 mg/dL   BUN 5 (L) 6 - 20 mg/dL   Creatinine, Ser 0.73 0.44 - 1.00 mg/dL   Calcium 9.0 8.9 - 10.3 mg/dL   GFR calc non Af Amer >60 >60 mL/min   GFR calc Af Amer >60 >60 mL/min   Anion gap 7 5 - 15  CBC     Status: None   Collection Time: 01/18/15  2:25 AM  Result Value Ref Range   WBC 6.9 4.0 - 10.5 K/uL   RBC 4.35 3.87 - 5.11 MIL/uL   Hemoglobin 12.9 12.0 - 15.0 g/dL   HCT 40.7 36.0 - 46.0 %   MCV 93.6 78.0 - 100.0 fL   MCH 29.7 26.0 - 34.0 pg   MCHC 31.7 30.0 - 36.0 g/dL   RDW 12.9 11.5 - 15.5 %   Platelets 184 150 - 400 K/uL    Studies/Results: No results  found.  Medications: Reviewed  @PROBHOSP @  1  NSTEMI  S/p   Pt s/p PTCA/ Synergy Stent to mid RCA  On ASA and Brilinta. OK to d/c today with outpt f/u in 2wks  Will need cardiac rehab  2.  HL  COntinue on statin.      LOS: 5 days   Dorris Carnes 01/18/2015, 10:26 AM

## 2015-01-18 NOTE — Progress Notes (Signed)
2 RN's Thurmond Butts and Trilby Drummer pulled Right Femoral arterial sheathe starting at 0044 and held pressure for 20 minutes. Also pulled venous sheathe at 0104 and held pressure for 10 minutes. Site is level 0. Will continue to monitor.

## 2015-01-18 NOTE — Care Management Note (Addendum)
Case Management Note  Patient Details  Name: Kathy Mclean MRN: [id number] Date of Birth: 07-Mar-1960  Subjective/Objective:    Adm w mi                Action/Plan: lives at home, no ins   Expected Discharge Date:                  Expected Discharge Plan:  Home/Self Care  In-House Referral:  Financial Counselor  Discharge planning Services  CM Consult, Medication Assistance, [facility]  Post Acute Care Choice:    Choice offered to:     DME Arranged:    DME Agency:     HH Arranged:    [place]:     Status of Service:     Medicare Important Message Given:    Date Medicare IM Given:    Medicare IM give by:    Date Additional Medicare IM Given:    Additional Medicare Important Message give by:     If discussed at [employer] of Stay Meetings, dates discussed:    Additional Comments: gave pt 30day free and 800 number to call [employer] to get addit 60days of brilinta. Placed brilinta pt assist form on shadow chart for md to sign. Left pt [facility] list.pt states she should be able to get other meds and will use brilinta card for this prescription.  Lacretia Leigh, RN 01/18/2015, 9:53 AM

## 2015-01-18 NOTE — Discharge Instructions (Signed)

## 2015-01-18 NOTE — Discharge Summary (Signed)
Discharge Summary   Patient ID: Kathy Mclean MRN: [id number], DOB/AGE: 06/25/1960 55 y.o. Admit date: 01/13/2015 D/C date:     01/18/2015  Primary Cardiologist Dr. Tamala Julian   Principal Problem:   NSTEMI (non-ST elevated myocardial infarction) [facility]) Active Problems:   CAD (coronary artery disease)   Tobacco abuse   HLD (hyperlipidemia)    Admission Dates: 01/13/15-01/18/15 Discharge Diagnosis: NSTEMI s/p thrombectomy/ DES to RCA. CTO of LAD  HPI: Kathy Mclean is a 55 y.o. female with a history of tobacco abuse and no past cardiac history who presented to [facility] on 01/13/15 with new onset chest pain.   She reported that the pain started the night prior to admission while going to work. 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 She noted central chest heaviness that radiated down left arm.  She drove herself to work but upon arrival, the pain had gotten worse and she became nauseated and vomited which prompted her to seek medical attention. She reported that her mother had her first MI in her 58s.    She presented to [facility] with chest pain and troponin noted to be elevated. She was placed on IV heparin and set up for LHC on 01/13/15 which revealed a subtotally occluded dominant RCA with a heavy thrombus burden and a LAD which had chronic total occlusion. Given large thrombus burden Dr Tamala Julian placed her on IV aggrastat and Heparin and completed the cath without any intervention.   CTCS was consulted. Dr. Servando Snare felt that the LAD appeared to be chronically occluded and very diffusely diseased and may not be graftable with surgery and recommended angioplasty of the RCA and then intensive medical therapy including smoking sensation.   After 4 days of  IV aggrastat and Heparin to try to minimize thrombus burden, she was set up for re-look cath and potential intervention on 01/17/15. Dr. Gwenlyn Found performed a successful PCI and stenting using AngioJet mechanical thrombectomy for a  highly thrombotic high-grade segmental mid dominant RCA stenosis using synergy DES. She was started on ASA and Brilinta and HD statin. A BB was not able to be added due to bradycardia.     Hospital Course  NSTEMI: s/p PTCA/Synergy Stent to mid RCA. CTO LAD. Per Dr. Thompson Caul cath note "At a separate setting, CTO PCI of the LAD would be considered." -- Peak troponin 6.14 -- On ASA and Brilinta as well as statin. No BB due to bradycardia -- 2D ECHO with normal LV function.  -- Encourgaged cardiac rehab and quitting smoking   HLD:  Continue on statin.LDL 122 with goal <70.  Pre-diabetes: HgA1c 5.8. Counseled on diet and exercise  Tobacco abuse: she needs to quit smoking. She is motivated to quit  The patient has had an uncomplicated hospital course and is recovering well. The radial and femoral catheter sites are stable. She has been seen by Dr. Harrington Challenger today and deemed ready for discharge home. All follow-up appointments have been scheduled. Smoking cessation was disscussed in length. A written RX for a 30 day free supply of Brilinta was provided for the patient. A work excuse note was provided as well. Discharge medications are listed below.   Discharge Vitals: Blood pressure 111/62, pulse 46, temperature 98.5 F (36.9 C), temperature source Oral, resp. rate 18, height _0  (1.651 m), weight 141 lb 8.6 oz (64.2 kg), SpO2 98 %.  Labs: Lab Results  Component Value Date   WBC 6.9 01/18/2015   HGB 12.9 01/18/2015  HCT 40.7 01/18/2015   MCV 93.6 01/18/2015   PLT 184 01/18/2015     Recent Labs Lab 01/18/15 0225  NA 143  K 3.8  CL 110  CO2 26  BUN 5*  CREATININE 0.73  CALCIUM 9.0  GLUCOSE 83   No results for input(s): CKTOTAL, CKMB, TROPONINI in the last 72 hours. Lab Results  Component Value Date   CHOL 168 01/13/2015   HDL 32* 01/13/2015   LDLCALC 122* 01/13/2015   TRIG 68 01/13/2015   Lab Results  Component Value Date   DDIMER 0.34 01/13/2015    Diagnostic  Studies/Procedures   Dg Chest 2 View  01/13/2015  CLINICAL DATA:  Midsternal chest pain. Nausea, vomiting, and shortness of breath. EXAM: CHEST  2 VIEW COMPARISON:  03/10/2011 FINDINGS: Heart size and mediastinal contours are unchanged. There is hyperinflation and coarsened interstitial markings, chronic. No superimposed pulmonary edema, consolidation, pleural effusion or pneumothorax. Osseous structures appear intact. Mid anterior wedging of lower thoracic vertebra is unchanged. IMPRESSION: Chronic hyperinflation and coarsened interstitial markings. No superimposed acute process. Electronically Signed   By: Jeb Levering M.D.   On: 01/13/2015 03:11   01/13/15  Procedures     Left Heart Cath and Coronary Angiography     Conclusion      Prox RCA to Mid RCA lesion, 99% stenosed.  Mid LAD to Dist LAD lesion, 100% stenosed.  Ost 1st Diag lesion, 75% stenosed.  Ost LAD lesion, 40% stenosed.  Ost Cx to Mid Cx lesion, 30% stenosed.  Dist LAD lesion, 100% stenosed.  Severe two-vessel coronary disease with chronic total occlusion of the proximal to mid LAD and high-grade acute thrombotic stenosis in the proximal to mid RCA (dominant vessel).  The LAD is diffusely diseased and is collateralized from left to left and from right to left. May or may not have surgical targets.The first diagonal which arises proximal to the total occlusion contains eccentric 70% stenosis and could be a target for bypass.  RCA is very dominant, gives collaterals to the apical LAD via an acute RCA marginal branch. The RCA is also receiving collaterals from the left circumflex. Massive thrombus is noted in the proximal to mid RCA. Distal flow is TIMI grade 2.  Preserved left ventricular systolic function with EF 55%. RECOMMENDATIONS:  Heart team approach. Surgical revascularization options need to be explored. RCA is obviously graftable but LAD may not be.  If surgical options are not favorable, consider prolonged  anticoagulation followed by percutaneous intervention of the right coronary to possibly include Angio-Jet thrombectomy. At a separate setting, CTO PCI of the LAD would be considered. Risk of intervention on the RCA is that of distal embolization and loss of collateral support with subsequent significant myocardial injury.  IV Aggrastat for 3-4 days and IV heparin if tolerated without bleeding or thrombocytopenia.     2D ECHO: 01/13/15 Study Conclusions - Left ventricle: The cavity size was normal. There was mild concentric hypertrophy. Systolic function was vigorous. The estimated ejection fraction was in the range of 65% to 70%. Wall motion was normal; there were no regional wall motion abnormalities. Doppler parameters are consistent with abnormal left ventricular relaxation (grade 1 diastolic dysfunction). Doppler parameters are consistent with elevated ventricular end-diastolic filling pressure. - Aortic valve: Trileaflet; normal thickness leaflets. - Mitral valve: Structurally normal valve. There was mild regurgitation. - Left atrium: The atrium was normal in size. - Right ventricle: The cavity size was normal. Wall thickness was normal. Systolic function was normal. -  Right atrium: The atrium was normal in size. - Pulmonary arteries: Systolic pressure was within the normal range. - Inferior vena cava: The vessel was normal in size. - Pericardium, extracardiac: There was no pericardial effusion.    01/17/15 Procedures    Coronary Stent Intervention    Conclusion       CARDIAC CATHETERIZATION / PCI  History obtained from chart review. Ms. Burgard is a 55 year old female who presented on 01/12/14 with chest pain and a non-STEMI. She underwent cardiac catheterization by Dr. Tamala Julian via the right radial approach revealing a subtotally occluded dominant RCA with a heavy thrombus burden and a LAD which had chronic total occlusion. Her EF was near normal. Her  troponin went up to 6. It was elected to place her on IV heparin and Aggrastat for 4 days in order to minimize the thrombus burden.. She presents now for relook angiography and potential intervention.   PROCEDURE DESCRIPTION:   The patient was brought to the second floor [facility] cath lab in the postabsorptive state. She was  premedicated with Valium 5 mg by mouth, IV Versed and fentanyl. Her right groinwas prepped and shaved in usual sterile fashion. Xylocaine 1% was used for local anesthesia. A 6 French sheath was inserted into the right common femoral artery using standard Seldinger technique. A 6 French sheath was inserted into the right common femoral vein. A 5 French balloon tipped permanent temporary transvenous pacemaker was then advanced into the RV apex and set at a rate of 70 with an MA of 5.  The patient received Angiomax bolus with an ACT of greater than 300. Using a 6 [place] JR4 guide catheter I engaged the ostium of the right coronary artery. I then crossed the lesion with a 014 [place] Guidewire and predilated the lesion with a 2.5 x 15 mm long balloon. The thrombus in the mid vessel distally embolized. I then used a 4 [place] coronary AngioJet catheter and perform mechanical thrombectomy on the distal thrombus successfully. Following this I carefully positioned and deployed a 4 mm x 28 mm long synergy drug-eluting stent at 16 atm (4.3 mm) resulting in reduction of a 95% thrombotic mid dominant RCA stenosis to 0% residual. It was some spasm of the distal vessel which was treated with intracoronary glycerin. The temperature pacemaker was then removed. The guidewire and catheter were removed and the sheaths were secured. The patient received 180 mg of Brilenta by mouth. She will remain on full dose Aggrastat for an additional 4 hours.  IMPRESSION:successful PCI and stenting using AngioJet mechanical thrombectomy for a highly thrombotic high-grade segmental mid dominant RCA  stenosis using synergy drug eluting stent. The patient remained hemodynamically And electrocardiographically stable throughout the procedure. She will be treated with dual antiplatelet therapy. The sheath will be removed in several hours and pressure held. She could potentially be discharged home tomorrow. She left the lab in stable condition.         Discharge Medications     Medication List    STOP taking these medications        aspirin 325 MG tablet  Replaced by:  aspirin 81 MG chewable tablet     ibuprofen 200 MG tablet  Commonly known as:  ADVIL,MOTRIN      TAKE these medications        aspirin 81 MG chewable tablet  Chew 1 tablet (81 mg total) by mouth daily.     atorvastatin 80 MG tablet  Commonly known as:  LIPITOR  Take 1 tablet (80 mg total) by mouth daily at 6 PM.     nitroGLYCERIN 0.4 MG SL tablet  Commonly known as:  NITROSTAT  Place 1 tablet (0.4 mg total) under the tongue every 5 (five) minutes x 3 doses as needed for chest pain.     ticagrelor 90 MG Tabs tablet  Commonly known as:  BRILINTA  Take 1 tablet (90 mg total) by mouth 2 (two) times daily.        Disposition   The patient will be discharged in stable condition to home. Discharge Instructions    AMB Referral to Cardiac Rehabilitation - Phase II    Complete by:  As directed   Diagnosis:  Myocardial Infarction          Follow-up Information    Follow up with Eileen Stanford, PA-C On 01/28/2015.   Specialties:  Cardiology, Radiology   Why:  @ 11am     Contact information:   [place] [id number] [phone]         Duration of Discharge Encounter: Greater than 30 minutes including physician and PA time.  SignedAngelena Form R PA-C 01/18/2015, 12:37 PM   Patient seen and examined  Agree with findings as noted by Kathlene November above  Pt is stable for d/c with plan as noted above .

## 2015-01-27 NOTE — Progress Notes (Signed)
Cardiology Office Note   Date:  01/28/2015   ID:  TYRAN NIX, DOB [birth year], MRN [id number]  PCP:  No PCP Per Patient  Cardiologist:  Dr.  Tamala Julian   Post hospital follow up    History of Present Illness: Kathy Mclean is a 55 y.o. female with a history of tobacco abuse, HLD, pre-diabetes, and CAD s/p NSTEMI: thrombectomy & PCI/DES to RCA who presents to clinic for post hospital follow up.   She reported that the pain started the night prior to admission while going to work. 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 She noted central chest heaviness that radiated down left arm. She drove herself to work but upon arrival, the pain had gotten worse and she became nauseated and vomited which prompted her to seek medical attention. She reported that her mother had her first MI in her 25s.   She presented to [facility] with chest pain and troponin noted to be elevated. She was placed on IV heparin and set up for LHC on 01/13/15 which revealed a subtotally occluded dominant RCA with a heavy thrombus burden and a LAD which had chronic total occlusion. Given large thrombus burden Dr Tamala Julian placed her on IV aggrastat and Heparin and completed the cath without any intervention.   CTCS was consulted. Dr. Servando Snare felt that the LAD appeared to be chronically occluded and very diffusely diseased and may not be graftable with surgery and recommended angioplasty of the RCA and then intensive medical therapy including smoking sensation.   After 4 days of IV aggrastat and Heparin to try to minimize thrombus burden, she was set up for re-look cath and potential intervention on 01/17/15. Dr. Gwenlyn Found performed a successful PCI and stenting using AngioJet mechanical thrombectomy for a highly thrombotic high-grade segmental mid dominant RCA stenosis using synergy DES. She was started on ASA and Brilinta and HD statin. A BB was not able to be added due to bradycardia.   Today she presents to  clinic for post hospital follow up. She had no chest pain or SOB. She does note some mild dyspnea. No LE edema, orthopnea, or PND. Although a couple times she has shot up in bed and can't catch her breath. No blood in her stool or urine. No dizziness or syncope. She is still smoking but is down to less than half pack a day. She has been emotional since leaving the hospital. She gets very tearful and doesn't know why. Being at home is also hard due to her disabled son and boyfriend.    Past Medical History  Diagnosis Date  . Uterine cancer ([place])   . CAD (coronary artery disease)     a. 01/2015: NSTEMI s/p PTCA/Synergy Stent to mid RCA. CTO LAD  . HLD (hyperlipidemia)   . Tobacco abuse     Past Surgical History  Procedure Laterality Date  . Abdominal hysterectomy    . Cardiac catheterization N/A 01/13/2015    Procedure: Left Heart Cath and Coronary Angiography;  Surgeon: Belva Crome, MD;  Location: [facility];  Service: Cardiovascular;  Laterality: N/A;  . Cardiac catheterization N/A 01/17/2015    Procedure: Coronary Stent Intervention;  Surgeon: Lorretta Harp, MD;  Location: [facility];  Service: Cardiovascular;  Laterality: N/A;  mid rca      Current Outpatient Prescriptions  Medication Sig Dispense Refill  . aspirin 81 MG chewable tablet Chew 1 tablet (81 mg total) by mouth daily.    Marland Kitchen  atorvastatin (LIPITOR) 80 MG tablet Take 1 tablet (80 mg total) by mouth daily at 6 PM. 90 tablet 3  . nitroGLYCERIN (NITROSTAT) 0.4 MG SL tablet Place 1 tablet (0.4 mg total) under the tongue every 5 (five) minutes x 3 doses as needed for chest pain. 25 tablet 12  . ticagrelor (BRILINTA) 90 MG TABS tablet Take 1 tablet (90 mg total) by mouth 2 (two) times daily. 180 tablet 4   No current facility-administered medications for this visit.    Allergies:   Review of patient's allergies indicates no known allergies.    Social History:  The patient  reports that she has been smoking.   She does not have any smokeless tobacco history on file. She reports that she does not drink alcohol or use illicit drugs.   Family History:  The patient's family history includes Heart attack in her mother; Stroke in her father. There is no history of Hypertension.    ROS:  Please see the history of present illness.   Otherwise, review of systems are positive for none.   All other systems are reviewed and negative.    PHYSICAL EXAM: VS:  BP 135/70 mmHg  Pulse 60  Ht 5\' 5"  (1.651 m)  Wt 182 lb (82.555 kg)  BMI 30.29 kg/m2 , BMI Body mass index is 30.29 kg/(m^2). GEN: Well nourished, well developed, in no acute distress HEENT: normal Neck: no JVD, carotid bruits, or masses Cardiac: RRR; no murmurs, rubs, or gallops,no edema  Respiratory:  clear to auscultation bilaterally, normal work of breathing GI: soft, nontender, nondistended, + BS MS: no deformity or atrophy Skin: warm and dry, no rash Neuro:  Strength and sensation are intact Psych: euthymic mood, full affect   EKG:  EKG is ordered today. The ekg ordered today demonstrates NSR with with TWI in inferior leads similar to post procedure ECG   Recent Labs: 01/13/2015: TSH 0.417 01/18/2015: BUN 5*; Creatinine, Ser 0.73; Hemoglobin 12.9; Platelets 184; Potassium 3.8; Sodium 143    Lipid Panel    Component Value Date/Time   CHOL 168 01/13/2015 0740   TRIG 68 01/13/2015 0740   HDL 32* 01/13/2015 0740   CHOLHDL 5.3 01/13/2015 0740   VLDL 14 01/13/2015 0740   LDLCALC 122* 01/13/2015 0740      Wt Readings from Last 3 Encounters:  01/28/15 182 lb (82.555 kg)  01/15/15 141 lb 8.6 oz (64.2 kg)      Other studies Reviewed: Additional studies/ records that were reviewed today include: LHC, 2D ECHO. Review of the above records demonstrates: see HPI   2D ECHO: 01/13/2015 LV EF: 65% -   70% Study Conclusions - Left ventricle: The cavity size was normal. There was mild   concentric hypertrophy. Systolic function was  vigorous. The   estimated ejection fraction was in the range of 65% to 70%. Wall   motion was normal; there were no regional wall motion   abnormalities. Doppler parameters are consistent with abnormal   left ventricular relaxation (grade 1 diastolic dysfunction).   Doppler parameters are consistent with elevated ventricular   end-diastolic filling pressure. - Aortic valve: Trileaflet; normal thickness leaflets. - Mitral valve: Structurally normal valve. There was mild   regurgitation. - Left atrium: The atrium was normal in size. - Right ventricle: The cavity size was normal. Wall thickness was   normal. Systolic function was normal. - Right atrium: The atrium was normal in size. - Pulmonary arteries: Systolic pressure was within the normal   range. -  Inferior vena cava: The vessel was normal in size. - Pericardium, extracardiac: There was no pericardial effusion.   ASSESSMENT AND PLAN:  RICKYA KETLER is a 55 y.o. female with a history of tobacco abuse, HLD, pre-diabetes, and CAD s/p NSTEMI: thrombectomy & PCI/DES to RCA who presents to clinic for post hospital follow up.   CAD/NSTEMI: s/p PTCA/Synergy Stent to mid RCA. CTO LAD. Per Dr. Thompson Caul cath note "At a separate setting, CTO PCI of the LAD would be considered." -- On ASA and Brilinta as well as statin. No BB due to bradycardia. Brilinta costs > 350$ a month and she cannot afford this. I have provided her a month worth of samples. I have filled out patient assistance forms for Brilinta to see if we can get it for her. Otherwise we will have to switch her to plavix. -- 2D ECHO with normal LV function.  -- Encourgaged cardiac rehab and quitting smoking   HLD: Continue on statin.LDL 122 with goal <70.  Pre-diabetes: HgA1c 5.8. Counseled on diet and exercise  Tobacco abuse: she needs to quit smoking. She is motivated to quit. Is willing to try patches.   Depression: very tearful. I have recommended that she establish  care with a PCP and try antidepressant if this does not get better .  Current medicines are reviewed at length with the patient today.  The patient does not have concerns regarding medicines.  The following changes have been made:  none  Labs/ tests ordered today include:  Orders Placed This Encounter  Procedures  . EKG 12-Lead    Disposition:   FU with Dr. Tamala Julian in 3 months   Signed, Crista Luria  01/28/2015 11:27 AM    [address], [place], [place]  [address] Phone: [phone]; Fax: [phone]

## 2015-01-28 ENCOUNTER — Encounter: Payer: Self-pay | Admitting: Physician Assistant

## 2015-01-28 ENCOUNTER — Encounter: Payer: Self-pay | Admitting: *Deleted

## 2015-01-28 ENCOUNTER — Ambulatory Visit (INDEPENDENT_AMBULATORY_CARE_PROVIDER_SITE_OTHER): Payer: Self-pay | Admitting: Physician Assistant

## 2015-01-28 VITALS — BP 135/70 | HR 60 | Ht 65.0 in | Wt 182.0 lb

## 2015-01-28 DIAGNOSIS — Z720 Tobacco use: Secondary | ICD-10-CM

## 2015-01-28 DIAGNOSIS — I2510 Atherosclerotic heart disease of native coronary artery without angina pectoris: Secondary | ICD-10-CM

## 2015-01-28 DIAGNOSIS — Z006 Encounter for examination for normal comparison and control in clinical research program: Secondary | ICD-10-CM

## 2015-01-28 MED ORDER — NICOTINE 21 MG/24HR TD PT24: 21.0000 mg | MEDICATED_PATCH | Freq: Every day | TRANSDERMAL | Status: DC

## 2015-01-28 NOTE — Progress Notes (Signed)
Ms. Dvorak meets criteria for the DAL-GENE trial. I spoke with her about the trial at length to include risks/benefits. She had the opportunity to read the consent and ask questions. She agreed to participate. She signed the informed consent. No protocol related procedures were performed prior to signing the consent. A copy was given to the Ms. Kalka. I let her know we will contact her when the results are back to see if she is a candidate for the main trial.

## 2015-01-28 NOTE — Patient Instructions (Addendum)
Medication Instructions:  Your physician recommends that you continue on your current medications as directed. Please refer to the Current Medication list given to you today. We have called in a prescription for a nicotine patch.  Apply as directed on the box.   Labwork: None ordered  Testing/Procedures: NONE ORDERED  Follow-Up: Your physician recommends that you schedule a follow-up appointment in: [place]       Any Other Special Instructions Will Be Listed Below (If Applicable).   If you need a refill on your cardiac medications before your next appointment, please call your pharmacy.

## 2015-02-01 ENCOUNTER — Telehealth: Payer: Self-pay | Admitting: *Deleted

## 2015-02-01 NOTE — Telephone Encounter (Signed)
I called patient to let her know that she did not have the genotype . Patient will not be able to continue in the Dal-Gene Study. I thanked her for her willingness to participate in the study.

## 2015-03-07 ENCOUNTER — Telehealth: Payer: Self-pay

## 2015-03-07 NOTE — Telephone Encounter (Signed)
Pt called and stated her Brilinta was going to cost her $319 a month and she cannot afford that. I informed her I would pass this on to Jenean Lindau, LPN who handles our prior authorizations. She verbalized understanding

## 2015-03-07 NOTE — Telephone Encounter (Signed)
Called patient at home and had to leave a voicemail message.

## 2015-03-09 NOTE — Telephone Encounter (Signed)
Left another voicemail message just now.

## 2015-04-29 ENCOUNTER — Encounter: Payer: Self-pay | Admitting: Interventional Cardiology

## 2015-04-29 ENCOUNTER — Ambulatory Visit (INDEPENDENT_AMBULATORY_CARE_PROVIDER_SITE_OTHER): Payer: PRIVATE HEALTH INSURANCE | Admitting: Interventional Cardiology

## 2015-04-29 VITALS — BP 140/96 | HR 68 | Ht 65.0 in | Wt 186.0 lb

## 2015-04-29 DIAGNOSIS — E785 Hyperlipidemia, unspecified: Secondary | ICD-10-CM | POA: Diagnosis not present

## 2015-04-29 DIAGNOSIS — I2510 Atherosclerotic heart disease of native coronary artery without angina pectoris: Secondary | ICD-10-CM | POA: Diagnosis not present

## 2015-04-29 DIAGNOSIS — Z720 Tobacco use: Secondary | ICD-10-CM

## 2015-04-29 LAB — HEPATIC FUNCTION PANEL
ALBUMIN: 4.2 g/dL (ref 3.6–5.1)
ALT: 18 U/L (ref 6–29)
AST: 18 U/L (ref 10–35)
Alkaline Phosphatase: 89 U/L (ref 33–130)
Bilirubin, Direct: 0.1 mg/dL (ref <=0.2)
Indirect Bilirubin: 0.4 mg/dL (ref 0.2–1.2)
Total Bilirubin: 0.5 mg/dL (ref 0.2–1.2)
Total Protein: 6.8 g/dL (ref 6.1–8.1)

## 2015-04-29 LAB — LIPID PANEL
CHOL/HDL RATIO: 5 ratio (ref <=5.0)
CHOLESTEROL: 199 mg/dL (ref 125–200)
HDL: 40 mg/dL — AB (ref >=46)
LDL Cholesterol: 120 mg/dL (ref <130)
Triglycerides: 193 mg/dL — ABNORMAL HIGH (ref <150)
VLDL: 39 mg/dL — AB (ref <30)

## 2015-04-29 MED ORDER — CLOPIDOGREL BISULFATE 75 MG PO TABS: 75.0000 mg | ORAL_TABLET | Freq: Every day | ORAL | Status: DC

## 2015-04-29 NOTE — Progress Notes (Signed)
Cardiology Office Note   Date:  04/29/2015   ID:  Kathy Mclean, DOB April 28, 1960, MRN [id number]  PCP:  No PCP Per Patient  Cardiologist:  Sinclair Grooms, MD   Chief Complaint  Patient presents with  . Coronary Artery Disease      History of Present Illness: Kathy Mclean is a 55 y.o. female who presents for Follow-up of CAD with synergy stent mid RCA during non-STEMI January 2017. She has a chronically occluded LAD. She presented with subtotal occlusion of the right coronary and underwent Heart Team approach. Finally decided that PCI was the best approach despite the chronically occluded LAD.  Has done well since stenting the right coronary. Denies angina. Marked improvement in exertional capacity. No nitroglycerin use.    Past Medical History  Diagnosis Date  . Uterine cancer ([place])   . CAD (coronary artery disease)     a. 01/2015: NSTEMI s/p PTCA/Synergy Stent to mid RCA. CTO LAD  . HLD (hyperlipidemia)   . Tobacco abuse     Past Surgical History  Procedure Laterality Date  . Abdominal hysterectomy    . Cardiac catheterization N/A 01/13/2015    Procedure: Left Heart Cath and Coronary Angiography;  Surgeon: Belva Crome, MD;  Location: [place] [facility];  Service: Cardiovascular;  Laterality: N/A;  . Cardiac catheterization N/A 01/17/2015    Procedure: Coronary Stent Intervention;  Surgeon: Lorretta Harp, MD;  Location: [place] [facility];  Service: Cardiovascular;  Laterality: N/A;  mid rca      Current Outpatient Prescriptions  Medication Sig Dispense Refill  . aspirin 81 MG chewable tablet Chew 1 tablet (81 mg total) by mouth daily.    Marland Kitchen atorvastatin (LIPITOR) 80 MG tablet Take 1 tablet (80 mg total) by mouth daily at 6 PM. 90 tablet 3  . nicotine (NICODERM CQ - DOSED IN MG/24 HOURS) 21 mg/24hr patch Place 1 patch (21 mg total) onto the skin daily. 28 patch 12  . nitroGLYCERIN (NITROSTAT) 0.4 MG SL tablet Place 1 tablet (0.4 mg total) under the tongue  every 5 (five) minutes x 3 doses as needed for chest pain. 25 tablet 12  . ticagrelor (BRILINTA) 90 MG TABS tablet Take 1 tablet (90 mg total) by mouth 2 (two) times daily. 180 tablet 4   No current facility-administered medications for this visit.    Allergies:   Review of patient's allergies indicates no known allergies.    Social History:  The patient  reports that she has been smoking.  She does not have any smokeless tobacco history on file. She reports that she does not drink alcohol or use illicit drugs.   Family History:  The patient's family history includes Heart attack in her mother; Stroke in her father. There is no history of Hypertension.    ROS:  Please see the history of present illness.   Otherwise, review of systems are positive for smoking, anxiety, and easy bruising. Unable to afford Brilinta..   All other systems are reviewed and negative.    PHYSICAL EXAM: VS:  BP 140/96 mmHg  Pulse 68  Ht 5\' 5"  (1.651 m)  Wt 186 lb (84.369 kg)  BMI 30.95 kg/m2 , BMI Body mass index is 30.95 kg/(m^2). GEN: Well nourished, well developed, in no acute distress HEENT: normal Neck: no JVD, carotid bruits, or masses Cardiac: RRR.  There is no murmur, rub, or gallop. There is no edema. Respiratory:  clear to auscultation bilaterally, normal work of  breathing. GI: soft, nontender, nondistended, + BS MS: no deformity or atrophy Skin: warm and dry, no rash Neuro:  Strength and sensation are intact Psych: euthymic mood, full affect   EKG:  EKG is not ordered today.   Recent Labs: 01/13/2015: TSH 0.417 01/18/2015: BUN 5*; Creatinine, Ser 0.73; Hemoglobin 12.9; Platelets 184; Potassium 3.8; Sodium 143    Lipid Panel    Component Value Date/Time   CHOL 168 01/13/2015 0740   TRIG 68 01/13/2015 0740   HDL 32* 01/13/2015 0740   CHOLHDL 5.3 01/13/2015 0740   VLDL 14 01/13/2015 0740   LDLCALC 122* 01/13/2015 0740      Wt Readings from Last 3 Encounters:  04/29/15 186 lb  (84.369 kg)  01/28/15 182 lb (82.555 kg)  01/15/15 141 lb 8.6 oz (64.2 kg)      Other studies Reviewed: Additional studies/ records that were reviewed today include: reviewed labs. The findings include no lipids since starting statin therapy.    ASSESSMENT AND PLAN:  1. Coronary artery disease involving native coronary artery of native heart without angina pectoris Doing relatively well. Has not had recurrence of angina and marked improvement in dyspnea on exertion.  2. Tobacco abuse Still smoking  3. HLD (hyperlipidemia) On therapy and no recent blood work since starting statin therapy.    Current medicines are reviewed at length with the patient today.  The patient has the following concerns regarding medicines: .  The following changes/actions have been instituted:    F/U 4-6 months  Stop smoking  Change Brilinta tom Plavix  Liver and lipid panel today  Labs/ tests ordered today include:  No orders of the defined types were placed in this encounter.     Disposition:   FU with HS in 6 month  Signed, Sinclair Grooms, MD  04/29/2015 11:25 AM    [address], [place], [place]  [address] Phone: [phone]; Fax: [phone]

## 2015-04-29 NOTE — Patient Instructions (Addendum)
Medication Instructions:  Your physician has recommended you make the following change in your medication: 1) STOP Brilinta 2) START Plavix 75 mg daily  -- take your last dose of Brilinta and first dose of Plavix together.  Then only take Plavix daily afterwards.  Labwork: Today: Lipid panel and hepatic function panel  Testing/Procedures: None ordered  Follow-Up: Your physician wants you to follow-up in: 4-6 months with Dr. Tamala Julian. You will receive a reminder letter in the mail two months in advance. If you don't receive a letter, please call our office to schedule the follow-up appointment.   Any Other Special Instructions Will Be Listed Below (If Applicable).  If you need a refill on your cardiac medications before your next appointment, please call your pharmacy.  Thank you for choosing [facility]!!

## 2015-08-15 ENCOUNTER — Encounter: Payer: Self-pay | Admitting: Interventional Cardiology

## 2015-08-29 ENCOUNTER — Ambulatory Visit (INDEPENDENT_AMBULATORY_CARE_PROVIDER_SITE_OTHER): Payer: PRIVATE HEALTH INSURANCE | Admitting: Interventional Cardiology

## 2015-08-29 ENCOUNTER — Encounter: Payer: Self-pay | Admitting: Interventional Cardiology

## 2015-08-29 ENCOUNTER — Encounter (INDEPENDENT_AMBULATORY_CARE_PROVIDER_SITE_OTHER): Payer: Self-pay

## 2015-08-29 VITALS — BP 124/82 | HR 64 | Ht 64.5 in | Wt 189.8 lb

## 2015-08-29 DIAGNOSIS — Z720 Tobacco use: Secondary | ICD-10-CM | POA: Diagnosis not present

## 2015-08-29 DIAGNOSIS — I2510 Atherosclerotic heart disease of native coronary artery without angina pectoris: Secondary | ICD-10-CM | POA: Diagnosis not present

## 2015-08-29 DIAGNOSIS — E785 Hyperlipidemia, unspecified: Secondary | ICD-10-CM

## 2015-08-29 NOTE — Progress Notes (Signed)
Cardiology Office Note    Date:  08/29/2015   ID:  Kathy Mclean, Kathy Mclean December 03, 1960, MRN [id number]  PCP:  No PCP Per Patient  Cardiologist: Kathy Grooms, MD   Chief Complaint  Patient presents with  . Coronary Artery Disease    History of Present Illness:  Kathy Mclean is a 55 y.o. female Follow-up of CAD with synergy stent mid RCA during non-STEMI January 2017. She has a chronically occluded LAD. She presented with subtotal occlusion of the right coronary and underwent Heart Team approach. Finally decided that PCI was the best approach despite the chronically occluded LAD  There are no complaints. Still smoking up to a half pack of cigarettes per day. This is a dramatic improvement. No medication side effects. No chest discomfort or dyspnea.   Past Medical History:  Diagnosis Date  . CAD (coronary artery disease)    a. 01/2015: NSTEMI s/p PTCA/Synergy Stent to mid RCA. CTO LAD  . HLD (hyperlipidemia)   . Tobacco abuse   . Uterine cancer [facility])     Past Surgical History:  Procedure Laterality Date  . ABDOMINAL HYSTERECTOMY    . CARDIAC CATHETERIZATION N/A 01/13/2015   Procedure: Left Heart Cath and Coronary Angiography;  Surgeon: Belva Crome, MD;  Location: [place] [facility];  Service: Cardiovascular;  Laterality: N/A;  . CARDIAC CATHETERIZATION N/A 01/17/2015   Procedure: Coronary Stent Intervention;  Surgeon: Lorretta Harp, MD;  Location: [place] [facility];  Service: Cardiovascular;  Laterality: N/A;  mid rca     Current Medications: Outpatient Medications Prior to Visit  Medication Sig Dispense Refill  . aspirin 81 MG chewable tablet Chew 1 tablet (81 mg total) by mouth daily.    Marland Kitchen atorvastatin (LIPITOR) 80 MG tablet Take 1 tablet (80 mg total) by mouth daily at 6 PM. 90 tablet 3  . clopidogrel (PLAVIX) 75 MG tablet Take 1 tablet (75 mg total) by mouth daily. 30 tablet 11  . nitroGLYCERIN (NITROSTAT) 0.4 MG SL tablet Place 1 tablet (0.4 mg total) under  the tongue every 5 (five) minutes x 3 doses as needed for chest pain. 25 tablet 12  . nicotine (NICODERM CQ - DOSED IN MG/24 HOURS) 21 mg/24hr patch Place 1 patch (21 mg total) onto the skin daily. (Patient not taking: Reported on 08/29/2015) 28 patch 12   No facility-administered medications prior to visit.      Allergies:   Review of patient's allergies indicates no known allergies.   Social History   Social History  . Marital status: Widowed    Spouse name: N/A  . Number of children: N/A  . Years of education: N/A   Social History Main Topics  . Smoking status: Current Every Day Smoker  . Smokeless tobacco: Never Used  . Alcohol use No  . Drug use: No  . Sexual activity: Not Asked   Other Topics Concern  . None   Social History Narrative  . None     Family History:  The patient's family history includes Heart attack in her mother; Stroke in her father.   ROS:   Please see the history of present illness.    Tobacco and emotional stress.  All other systems reviewed and are negative.   PHYSICAL EXAM:   VS:  BP 124/82   Pulse 64   Ht 5' 4.5" (1.638 m)   Wt 189 lb 12.8 oz (86.1 kg)   BMI 32.08 kg/m    GEN: Well  nourished, well developed, in no acute distress  HEENT: normal  Neck: no JVD, carotid bruits, or masses Cardiac: RRR; no murmurs, rubs, or gallops,no edema  Respiratory:  clear to auscultation bilaterally, normal work of breathing GI: soft, nontender, nondistended, + BS MS: no deformity or atrophy  Skin: warm and dry, no rash Neuro:  Alert and Oriented x 3, Strength and sensation are intact Psych: euthymic mood, full affect  Wt Readings from Last 3 Encounters:  08/29/15 189 lb 12.8 oz (86.1 kg)  04/29/15 186 lb (84.4 kg)  01/28/15 182 lb (82.6 kg)      Studies/Labs Reviewed:   EKG:  EKG  Not performed  Recent Labs: 01/13/2015: TSH 0.417 01/18/2015: BUN 5; Creatinine, Ser 0.73; Hemoglobin 12.9; Platelets 184; Potassium 3.8; Sodium 143 04/29/2015:  ALT 18   Lipid Panel    Component Value Date/Time   CHOL 199 04/29/2015 1148   TRIG 193 (H) 04/29/2015 1148   HDL 40 (L) 04/29/2015 1148   CHOLHDL 5.0 04/29/2015 1148   VLDL 39 (H) 04/29/2015 1148   LDLCALC 120 04/29/2015 1148    Additional studies/ records that were reviewed today include:  None    ASSESSMENT:    1. Coronary artery disease involving native coronary artery of native heart without angina pectoris   2. Tobacco abuse   3. HLD (hyperlipidemia)      PLAN:  In order of problems listed above:  1. Stable. Known total occlusion of the mid to distal LAD collateralized from the right coronary which is stented. She is asymptomatic. 2. Smoking cessation is advocated. I encouraged her and she is actually doing quite well having decreased tobacco use from 2 and half packs per day down to one half pack per day. 3. Low-fat diet, plant based is recommended. Aerobic activity with decreased caloric intake and weight loss is recommended. Liver and lipid panel in 6 months.    Medication Adjustments/Labs and Tests Ordered: Current medicines are reviewed at length with the patient today.  Concerns regarding medicines are outlined above.  Medication changes, Labs and Tests ordered today are listed in the Patient Instructions below. There are no Patient Instructions on file for this visit.   Signed, Kathy Grooms, MD  08/29/2015 9:50 AM    [address], [place], [place]  [address] Phone: [phone]; Fax: [phone]

## 2015-08-29 NOTE — Patient Instructions (Signed)
Medication Instructions:  Your physician recommends that you continue on your current medications as directed. Please refer to the Current Medication list given to you today.   Labwork: Your physician recommends that you return for a FASTING lipid profile and hepatic in 6 months  Testing/Procedures: None ordered  Follow-Up: Your physician wants you to follow-up in: 6 months with Dr.Smith You will receive a reminder letter in the mail two months in advance. If you don't receive a letter, please call our office to schedule the follow-up appointment.   Any Other Special Instructions Will Be Listed Below (If Applicable).     If you need a refill on your cardiac medications before your next appointment, please call your pharmacy.

## 2016-02-22 ENCOUNTER — Other Ambulatory Visit: Payer: Self-pay | Admitting: Physician Assistant

## 2016-04-30 ENCOUNTER — Other Ambulatory Visit: Payer: Self-pay | Admitting: Interventional Cardiology

## 2016-08-28 ENCOUNTER — Other Ambulatory Visit: Payer: Self-pay | Admitting: Physician Assistant

## 2016-08-28 ENCOUNTER — Other Ambulatory Visit: Payer: Self-pay | Admitting: Interventional Cardiology

## 2016-08-28 MED ORDER — CLOPIDOGREL BISULFATE 75 MG PO TABS: 75.0000 mg | ORAL_TABLET | Freq: Every day | ORAL | 0 refills | Status: AC

## 2016-08-29 ENCOUNTER — Other Ambulatory Visit: Payer: Self-pay | Admitting: Interventional Cardiology

## 2016-08-29 NOTE — Telephone Encounter (Signed)
clopidogrel (PLAVIX) 75 MG tablet  Medication  Date: 08/28/2016 Department: [facility] Office Ordering/Authorizing: Belva Crome, MD  Order Providers   Prescribing Provider Encounter Provider  Belva Crome, MD Eileen Stanford, PA-C  Medication Detail    Disp Refills Start End   clopidogrel (PLAVIX) 75 MG tablet 30 tablet 0 08/28/2016    Sig - Route: Take 1 tablet (75 mg total) by mouth daily. PLEASE SCHEDULE APPOINTMENT - Oral   Sent to pharmacy as: clopidogrel (PLAVIX) 75 MG tablet   E-Prescribing Status: Receipt confirmed by pharmacy (08/28/2016 9:58 AM EDT)   Pharmacy   [facility], [place]   Refill not appropriate. Refills on file

## 2016-09-11 ENCOUNTER — Other Ambulatory Visit: Payer: Self-pay | Admitting: Interventional Cardiology

## 2016-09-11 NOTE — Telephone Encounter (Signed)
Medication Detail    Disp Refills Start End   clopidogrel (PLAVIX) 75 MG tablet 30 tablet 0 08/28/2016    Sig - Route: Take 1 tablet (75 mg total) by mouth daily. PLEASE SCHEDULE APPOINTMENT - Oral   Sent to pharmacy as: clopidogrel (PLAVIX) 75 MG tablet   E-Prescribing Status: Receipt confirmed by pharmacy (08/28/2016 9:58 AM EDT)   Pharmacy   [facility], [place]

## 2016-10-13 ENCOUNTER — Other Ambulatory Visit: Payer: Self-pay | Admitting: Interventional Cardiology

## 2016-11-14 ENCOUNTER — Other Ambulatory Visit: Payer: Self-pay | Admitting: Interventional Cardiology

## 2016-11-29 ENCOUNTER — Other Ambulatory Visit: Payer: Self-pay | Admitting: Interventional Cardiology

## 2016-11-29 NOTE — Telephone Encounter (Signed)
Medication Detail    Disp Refills Start End   atorvastatin (LIPITOR) 80 MG tablet 15 tablet 0 11/14/2016    Sig: Take 1 tablet by mouth daily at 6 PM. Overdue for appt, needs to call and schedule for further refills or obtain from pcp 3rd/final attempt   Sent to pharmacy as: atorvastatin (LIPITOR) 80 MG tablet   E-Prescribing Status: Receipt confirmed by pharmacy (11/14/2016 3:44 PM EST)   Pharmacy   [id number] - [place], [place]

## 2017-02-21 ENCOUNTER — Other Ambulatory Visit: Payer: Self-pay

## 2017-02-21 ENCOUNTER — Encounter (HOSPITAL_COMMUNITY): Payer: Self-pay | Admitting: Emergency Medicine

## 2017-02-21 DIAGNOSIS — I252 Old myocardial infarction: Secondary | ICD-10-CM | POA: Insufficient documentation

## 2017-02-21 DIAGNOSIS — J449 Chronic obstructive pulmonary disease, unspecified: Secondary | ICD-10-CM | POA: Insufficient documentation

## 2017-02-21 DIAGNOSIS — R072 Precordial pain: Secondary | ICD-10-CM | POA: Diagnosis not present

## 2017-02-21 DIAGNOSIS — E785 Hyperlipidemia, unspecified: Secondary | ICD-10-CM | POA: Diagnosis not present

## 2017-02-21 DIAGNOSIS — F17200 Nicotine dependence, unspecified, uncomplicated: Secondary | ICD-10-CM | POA: Insufficient documentation

## 2017-02-21 DIAGNOSIS — I2510 Atherosclerotic heart disease of native coronary artery without angina pectoris: Secondary | ICD-10-CM | POA: Diagnosis not present

## 2017-02-21 DIAGNOSIS — R079 Chest pain, unspecified: Secondary | ICD-10-CM | POA: Diagnosis present

## 2017-02-21 NOTE — ED Triage Notes (Signed)
Patient with chest pain, she states that it is sharp pain on the side of her right breast.  Hurts worse with coughing.  Patient denies any shortness of breath at this time.  No nausea or vomiting.

## 2017-02-22 ENCOUNTER — Emergency Department (HOSPITAL_COMMUNITY)
Admission: EM | Admit: 2017-02-22 | Discharge: 2017-02-22 | Disposition: A | Discharge status: Home / Self Care | Payer: Commercial Managed Care - PPO | Attending: Emergency Medicine | Admitting: Emergency Medicine

## 2017-02-22 ENCOUNTER — Emergency Department (HOSPITAL_COMMUNITY): Payer: Commercial Managed Care - PPO

## 2017-02-22 DIAGNOSIS — J449 Chronic obstructive pulmonary disease, unspecified: Secondary | ICD-10-CM

## 2017-02-22 DIAGNOSIS — R072 Precordial pain: Secondary | ICD-10-CM

## 2017-02-22 LAB — BASIC METABOLIC PANEL
Anion gap: 9 (ref 5–15)
BUN: 8 mg/dL (ref 6–20)
CALCIUM: 9.4 mg/dL (ref 8.9–10.3)
CO2: 24 mmol/L (ref 22–32)
Chloride: 106 mmol/L (ref 101–111)
Creatinine, Ser: 0.76 mg/dL (ref 0.44–1.00)
Glucose, Bld: 118 mg/dL — ABNORMAL HIGH (ref 65–99)
Potassium: 3.8 mmol/L (ref 3.5–5.1)
SODIUM: 139 mmol/L (ref 135–145)

## 2017-02-22 LAB — I-STAT BETA HCG BLOOD, ED (MC, WL, AP ONLY): I-stat hCG, quantitative: <5 m[IU]/mL (ref <5)

## 2017-02-22 LAB — CBC
HCT: 47.3 % — ABNORMAL HIGH (ref 36.0–46.0)
Hemoglobin: 15.3 g/dL — ABNORMAL HIGH (ref 12.0–15.0)
MCH: 30.7 pg (ref 26.0–34.0)
MCHC: 32.3 g/dL (ref 30.0–36.0)
MCV: 94.8 fL (ref 78.0–100.0)
PLATELETS: 211 10*3/uL (ref 150–400)
RBC: 4.99 MIL/uL (ref 3.87–5.11)
RDW: 12.9 % (ref 11.5–15.5)
WBC: 9.9 10*3/uL (ref 4.0–10.5)

## 2017-02-22 LAB — I-STAT TROPONIN, ED: TROPONIN I, POC: 0 ng/mL (ref 0.00–0.08)

## 2017-02-22 NOTE — Discharge Instructions (Signed)

## 2017-02-22 NOTE — ED Provider Notes (Signed)
[facility] EMERGENCY DEPARTMENT Provider Note   CSN: [id number] Arrival date & time: 02/21/17  2341     History   Chief Complaint Chief Complaint  Patient presents with  . Chest Pain    HPI Kathy Mclean is a 57 y.o. female.  The history is provided by the patient.  Chest Pain   This is a new problem. The current episode started yesterday. The problem occurs constantly. The problem has not changed since onset.Pain location: Right axillary region. The pain is moderate. The quality of the pain is described as sharp. The pain does not radiate. The symptoms are aggravated by certain positions. Pertinent negatives include no fever, no shortness of breath and no vomiting. She has tried nothing for the symptoms.   Patient reports that she coughed and had immediate pain in right axillary region.  Since that time she has pain with cough and palpation only.  She has no other complaints.  No new shortness of breath/fever.  No hemoptysis.  Pain is also worse with movement.  No anterior chest pain.  She does have a history of prior MI, but reports this is not similar to that episode Patient suspect she strained her chest wall due to work, and worsened with coughing Past Medical History:  Diagnosis Date  . CAD (coronary artery disease)    a. 01/2015: NSTEMI s/p PTCA/Synergy Stent to mid RCA. CTO LAD  . HLD (hyperlipidemia)   . Tobacco abuse   . Uterine cancer [facility])     Patient Active Problem List   Diagnosis Date Noted  . CAD (coronary artery disease)   . Tobacco abuse   . HLD (hyperlipidemia)   . NSTEMI (non-ST elevated myocardial infarction) ([place]) 01/13/2015    Past Surgical History:  Procedure Laterality Date  . ABDOMINAL HYSTERECTOMY    . CARDIAC CATHETERIZATION N/A 01/13/2015   Procedure: Left Heart Cath and Coronary Angiography;  Surgeon: Belva Crome, MD;  Location: [facility];  Service: Cardiovascular;  Laterality: N/A;  . CARDIAC CATHETERIZATION  N/A 01/17/2015   Procedure: Coronary Stent Intervention;  Surgeon: Lorretta Harp, MD;  Location: [place] [facility];  Service: Cardiovascular;  Laterality: N/A;  mid rca     OB History    No data available       Home Medications    Prior to Admission medications   Medication Sig Start Date End Date Taking? Authorizing Provider  nitroGLYCERIN (NITROSTAT) 0.4 MG SL tablet Place 1 tablet (0.4 mg total) under the tongue every 5 (five) minutes x 3 doses as needed for chest pain. 01/18/15  Yes Eileen Stanford, PA-C  aspirin 81 MG chewable tablet Chew 1 tablet (81 mg total) by mouth daily. Patient not taking: Reported on 02/22/2017 01/18/15   Eileen Stanford, PA-C  atorvastatin (LIPITOR) 80 MG tablet Take 1 tablet (80 mg total) by mouth daily at 6 PM. Please make overdue appt with Dr. Tamala Julian before anymore refills. 2nd attempt Patient not taking: Reported on 02/22/2017 11/29/16   Belva Crome, MD  clopidogrel (PLAVIX) 75 MG tablet Take 1 tablet (75 mg total) by mouth daily. PLEASE SCHEDULE APPOINTMENT Patient not taking: Reported on 02/22/2017 08/28/16   Belva Crome, MD    Family History Family History  Problem Relation Age of Onset  . Heart attack Mother   . Stroke Father   . Hypertension Neg Hx     Social History Social History   Tobacco Use  . Smoking  status: Current Every Day Smoker  . Smokeless tobacco: Never Used  Substance Use Topics  . Alcohol use: No  . Drug use: No     Allergies   Patient has no known allergies.   Review of Systems Review of Systems  Constitutional: Negative for fever.  Respiratory: Negative for shortness of breath.   Cardiovascular: Positive for chest pain.  Gastrointestinal: Negative for vomiting.  All other systems reviewed and are negative.    Physical Exam Updated Vital Signs BP (!) 154/92   Pulse 77   Temp 98.7 F (37.1 C) (Oral)   Resp (!) 24   Ht 1.651 m (5\' 5" )   Wt 82.6 kg (182 lb)   SpO2 97%   BMI 30.29 kg/m    Physical Exam  CONSTITUTIONAL: Well developed/well nourished HEAD: Normocephalic/atraumatic EYES: EOMI/PERRL ENMT: Mucous membranes moist NECK: supple no meningeal signs SPINE/BACK:entire spine nontender CV: S1/S2 noted, no murmurs/rubs/gallops noted LUNGS: Coarse breath sounds noted, no crackles noted no significant wheeze noted Chest -tenderness along right axilla, no bruising or erythema noted, no crepitus ABDOMEN: soft, nontender NEURO: Pt is awake/alert/appropriate, moves all extremitiesx4.     EXTREMITIES: pulses normal/equal, full ROM SKIN: warm, color normal PSYCH: no abnormalities of mood noted, alert and oriented to situation  ED Treatments / Results  Labs (all labs ordered are listed, but only abnormal results are displayed) Labs Reviewed  BASIC METABOLIC PANEL - Abnormal; Notable for the following components:      Result Value   Glucose, Bld 118 (*)    All other components within normal limits  CBC - Abnormal; Notable for the following components:   Hemoglobin 15.3 (*)    HCT 47.3 (*)    All other components within normal limits  I-STAT TROPONIN, ED  I-STAT BETA HCG BLOOD, ED ([facility], [facility], [facility] ONLY)    EKG  EKG Interpretation  Date/Time:  Thursday February 21 2017 23:49:16 EST Ventricular Rate:  84 PR Interval:  170 QRS Duration: 84 QT Interval:  354 QTC Calculation: 418 R Axis:   67 Text Interpretation:  Normal sinus rhythm Normal ECG Interpretation limited secondary to artifact Confirmed by Ripley Fraise [phone]) on 02/22/2017 2:52:29 AM       Radiology Dg Chest 2 View  Result Date: 02/22/2017 CLINICAL DATA:  Hervey Ard RIGHT chest pain, worse with coughing. History of tobacco abuse. EXAM: CHEST  2 VIEW COMPARISON:  Chest radiograph January 13, 2015 FINDINGS: Cardiomediastinal silhouette is normal. No pleural effusions or focal consolidations. Mild chronic interstitial changes and increased lung volumes. Trachea projects midline and there is no pneumothorax.  Small suspected hiatal hernia. Soft tissue planes and included osseous structures are non-suspicious. IMPRESSION: Stable probable COPD. Electronically Signed   By: Elon Alas M.D.   On: 02/22/2017 00:22    Procedures Procedures (including critical care time)  Medications Ordered in ED Medications - No data to display   Initial Impression / Assessment and Plan / ED Course  I have reviewed the triage vital signs and the nursing notes.  Pertinent labs & imaging results that were available during my care of the patient were reviewed by me and considered in my medical decision making (see chart for details).     Strong suspicion this is a chest wall strain.  Pain is easily reproducible with palpation and movement.  workup is unremarkable at this time.  Advised patient to quit smoking.  Final Clinical Impressions(s) / ED Diagnoses   Final diagnoses:  Precordial pain  Chronic obstructive pulmonary  disease, unspecified COPD type [facility])    ED Discharge Orders    None       Ripley Fraise, MD 02/22/17 [phone]

## 2017-09-17 IMAGING — DX DG CHEST 2V
2 series · 2 of 2 positions shown · non-contrast
Comparison: 03/10/2011

CLINICAL DATA: Midsternal chest pain. Nausea, vomiting, and
shortness of breath.

EXAM:
CHEST  2 VIEW

[chest pa]
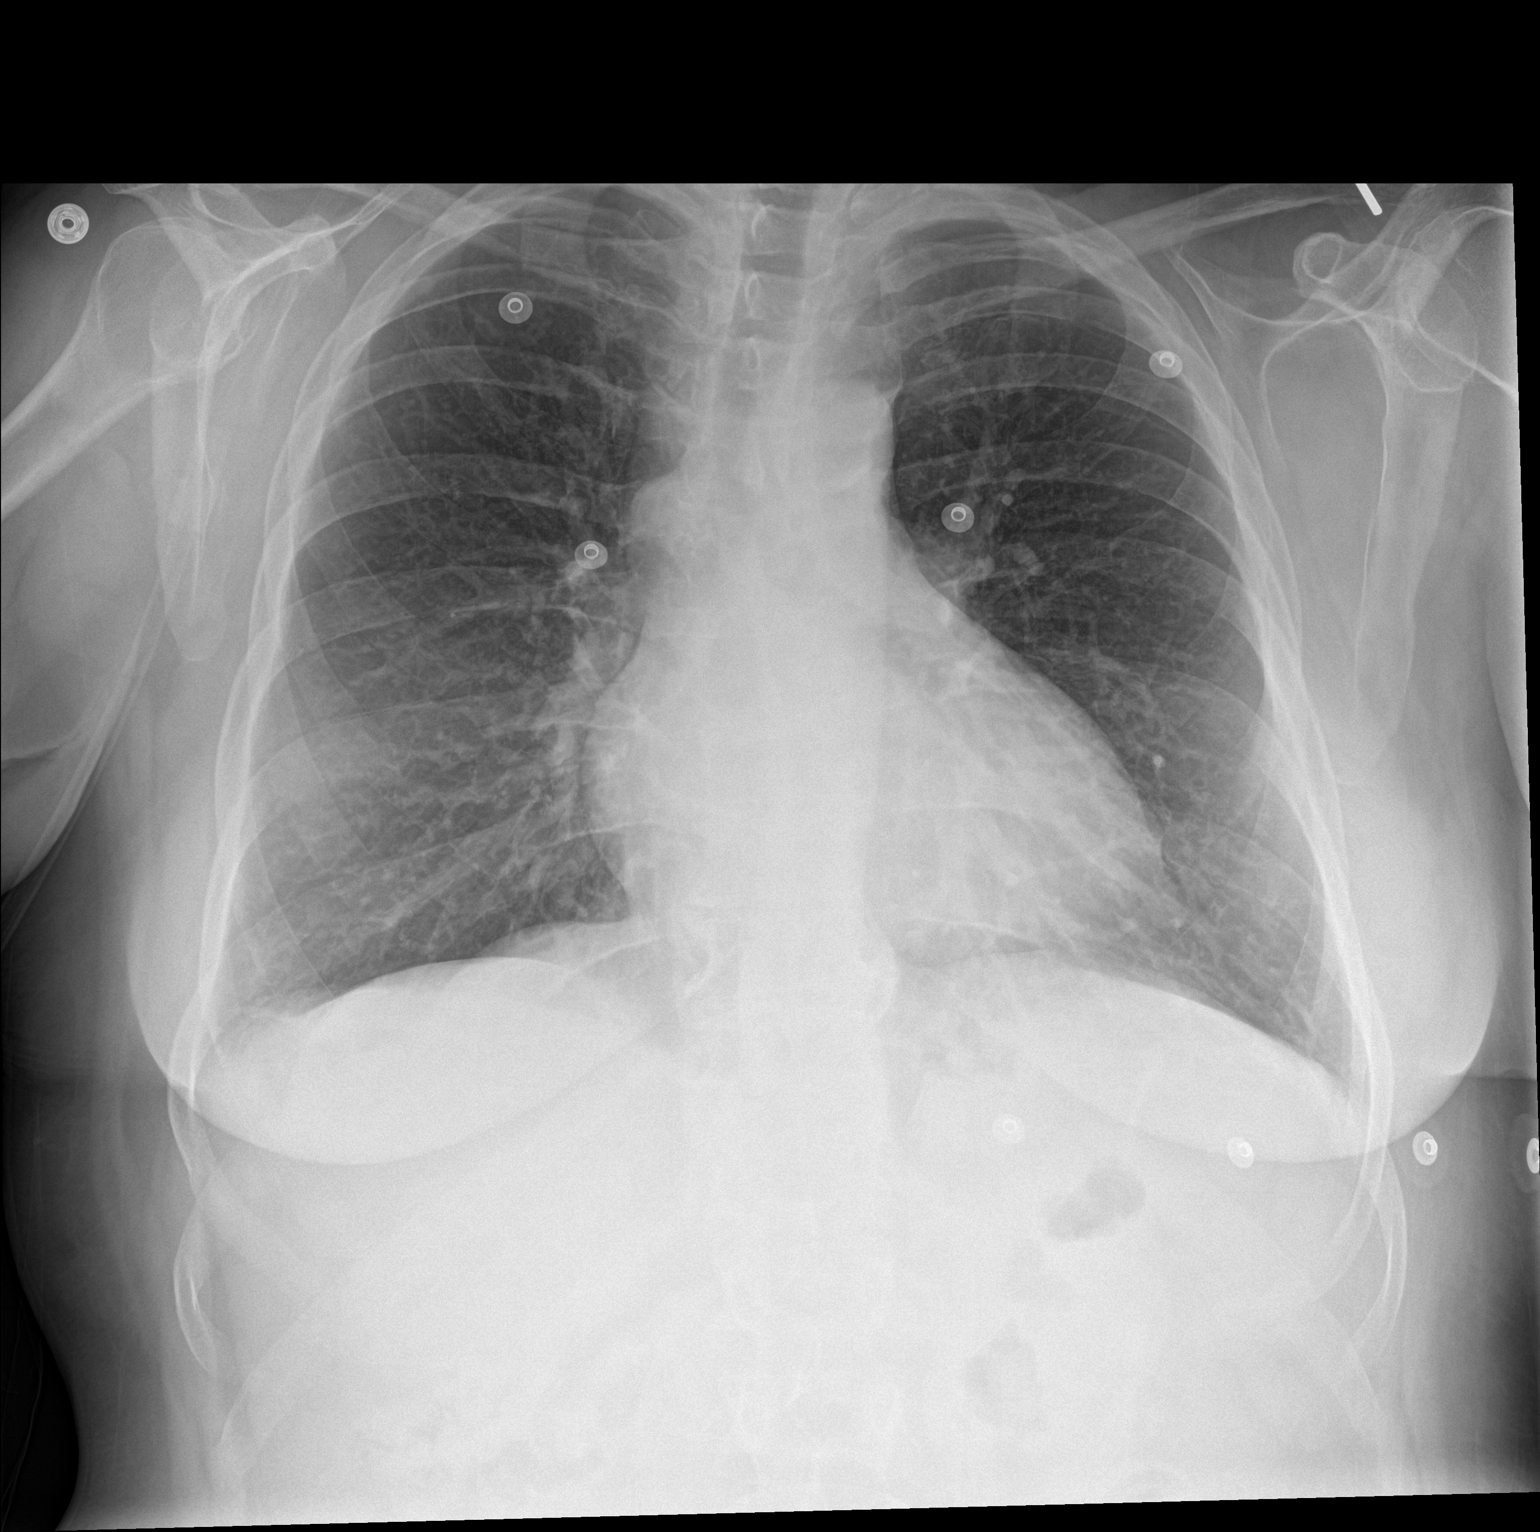

[chest lat]
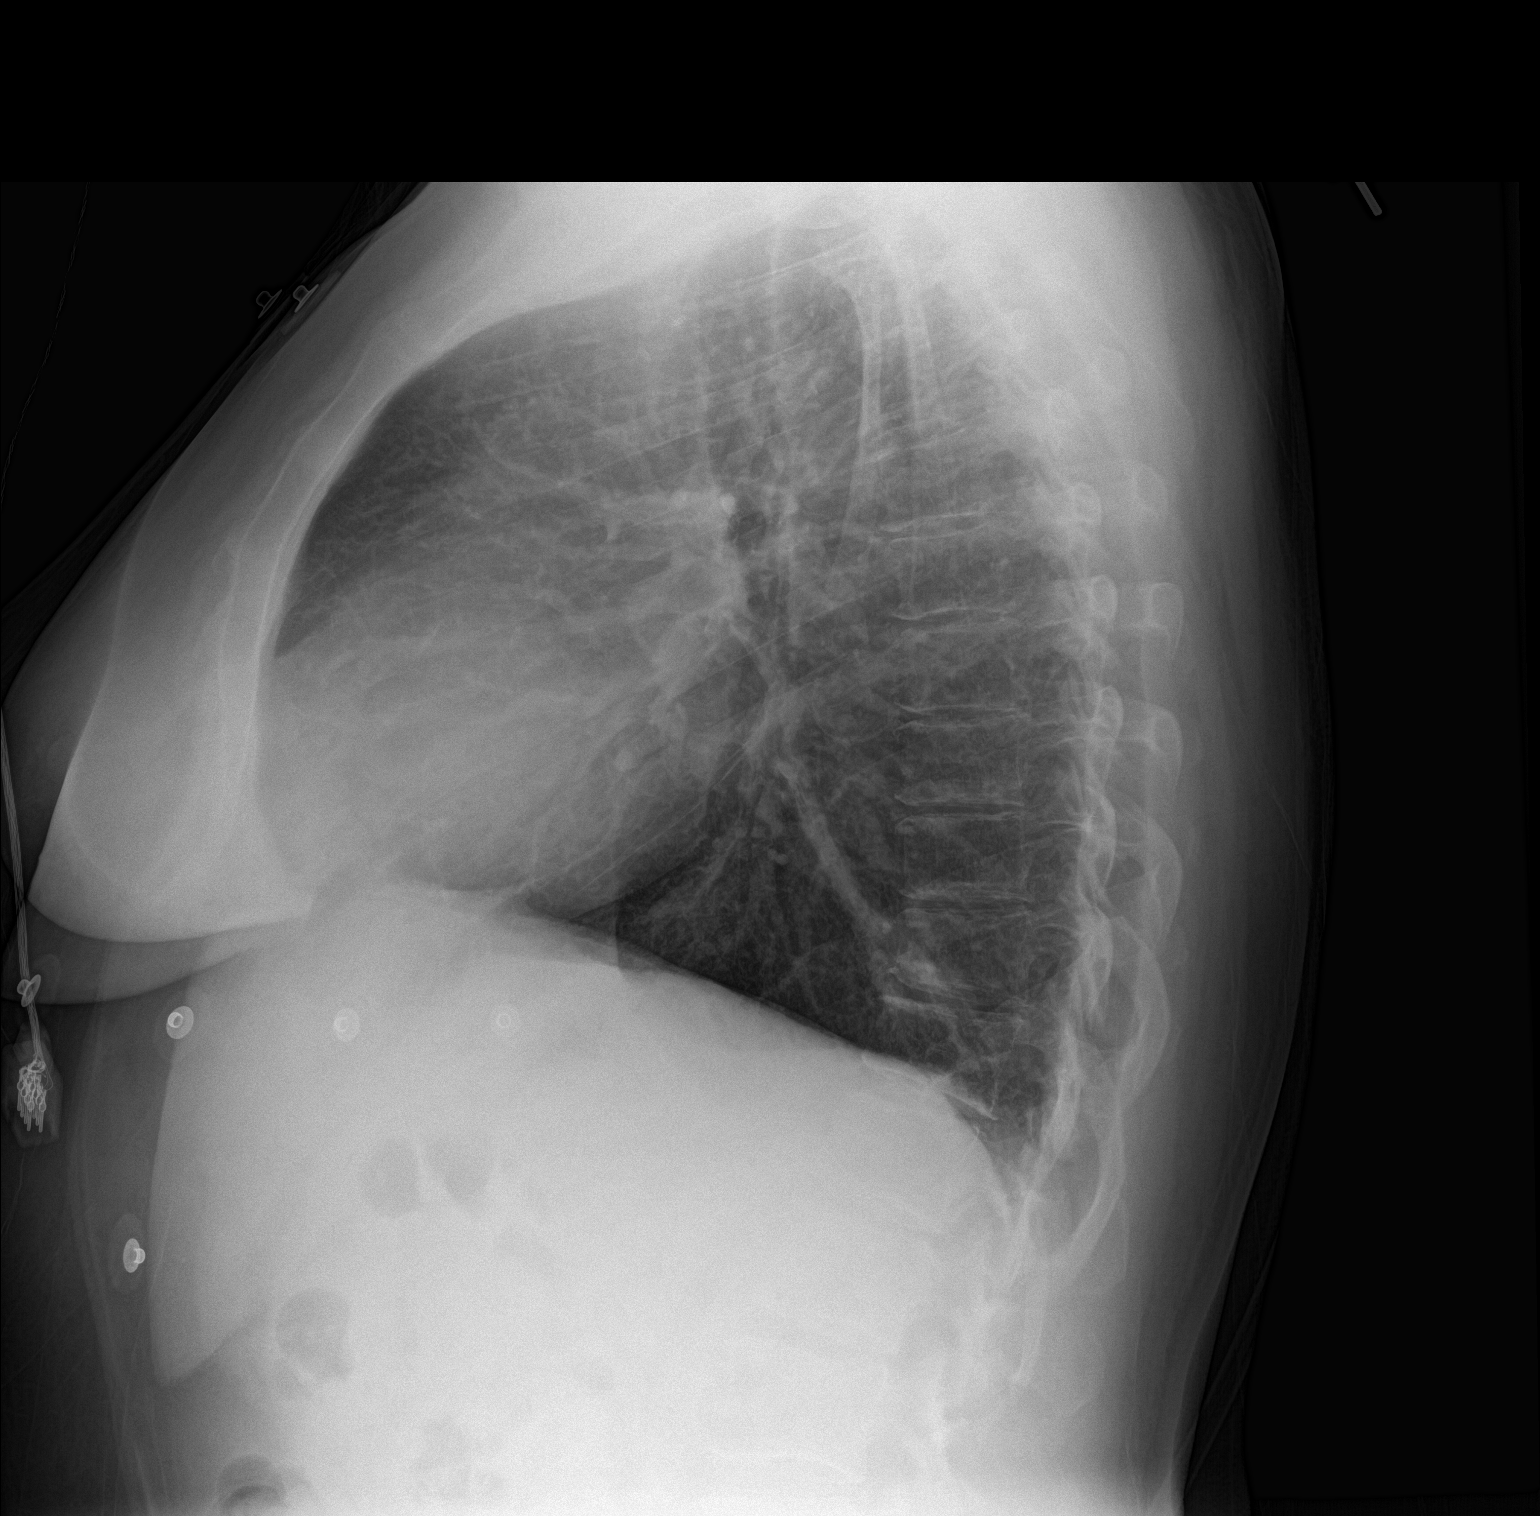

[2 of 2 positions shown; findings below may reference images not displayed]

FINDINGS: Heart size and mediastinal contours are unchanged. There is
hyperinflation and coarsened interstitial markings, chronic. No
superimposed pulmonary edema, consolidation, pleural effusion or
pneumothorax. Osseous structures appear intact. Mid anterior wedging
of lower thoracic vertebra is unchanged.
IMPRESSION: Chronic hyperinflation and coarsened interstitial markings. No
superimposed acute process.

## 2019-10-28 IMAGING — DX DG CHEST 2V
2 series · 2 of 2 positions shown · non-contrast
Comparison: Chest radiograph January 13, 2015

CLINICAL DATA: Sharp RIGHT chest pain, worse with coughing. History
of tobacco abuse.

EXAM:
CHEST  2 VIEW

[chest pa]
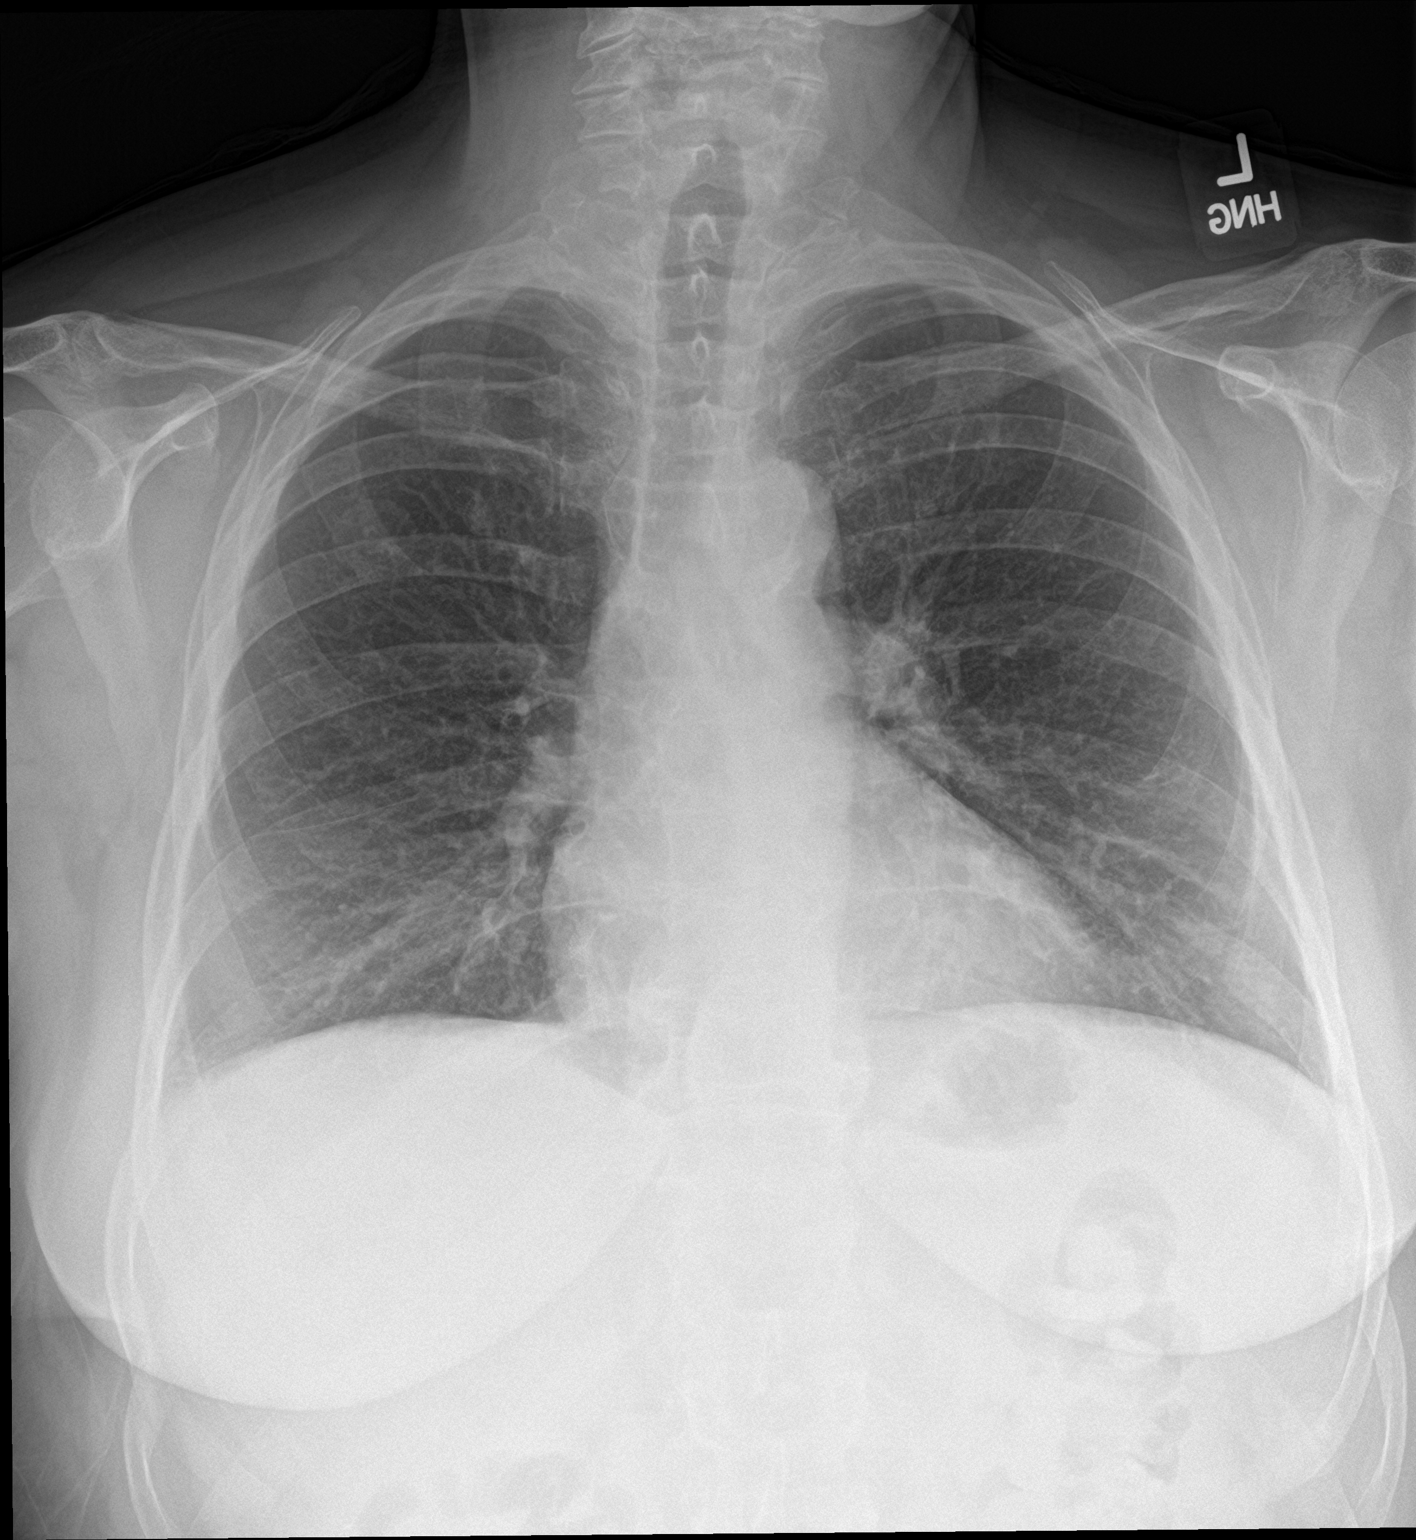

[chest lat]
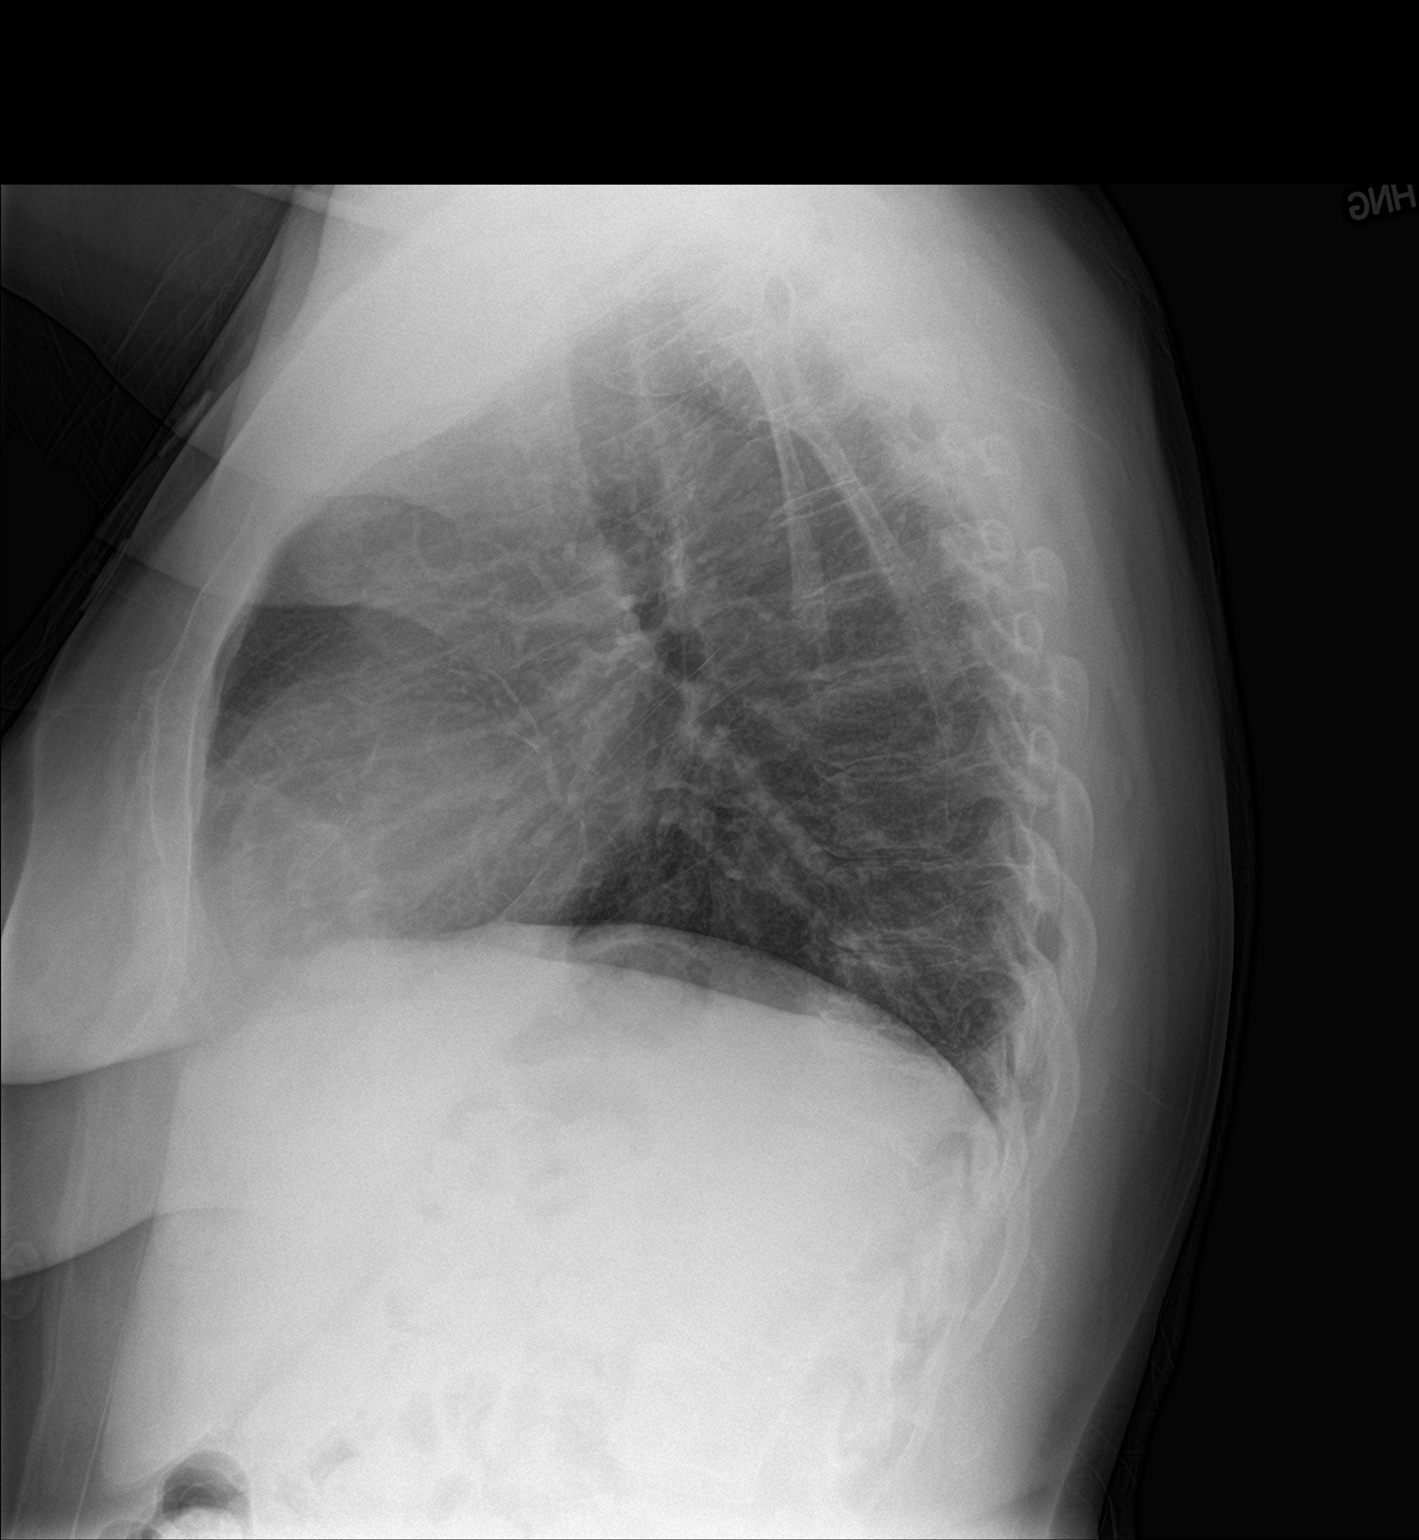

[2 of 2 positions shown; findings below may reference images not displayed]

FINDINGS: Cardiomediastinal silhouette is normal. No pleural effusions or
focal consolidations. Mild chronic interstitial changes and
increased lung volumes. Trachea projects midline and there is no
pneumothorax. Small suspected hiatal hernia. Soft tissue planes and
included osseous structures are non-suspicious.
IMPRESSION: Stable probable COPD.

## 2021-06-03 ENCOUNTER — Emergency Department
Admission: EM | Admit: 2021-06-03 | Discharge: 2021-06-03 | Disposition: A | Discharge status: Home / Self Care | Payer: Commercial Managed Care - PPO | Attending: Emergency Medicine | Admitting: Emergency Medicine

## 2021-06-03 ENCOUNTER — Encounter: Payer: Self-pay | Admitting: Medical Oncology

## 2021-06-03 DIAGNOSIS — I2510 Atherosclerotic heart disease of native coronary artery without angina pectoris: Secondary | ICD-10-CM | POA: Insufficient documentation

## 2021-06-03 DIAGNOSIS — R110 Nausea: Secondary | ICD-10-CM | POA: Insufficient documentation

## 2021-06-03 DIAGNOSIS — R197 Diarrhea, unspecified: Secondary | ICD-10-CM | POA: Insufficient documentation

## 2021-06-03 DIAGNOSIS — R531 Weakness: Secondary | ICD-10-CM | POA: Insufficient documentation

## 2021-06-03 LAB — URINALYSIS, ROUTINE W REFLEX MICROSCOPIC
Bilirubin Urine: NEGATIVE
Glucose, UA: NEGATIVE mg/dL
Hgb urine dipstick: NEGATIVE
Ketones, ur: NEGATIVE mg/dL
Leukocytes,Ua: NEGATIVE
Nitrite: NEGATIVE
Protein, ur: NEGATIVE mg/dL
Specific Gravity, Urine: 1.027 (ref 1.005–1.030)
pH: 5 (ref 5.0–8.0)

## 2021-06-03 LAB — LIPASE, BLOOD: Lipase: 24 U/L (ref 11–51)

## 2021-06-03 LAB — COMPREHENSIVE METABOLIC PANEL
ALT: 44 U/L (ref 0–44)
AST: 45 U/L — ABNORMAL HIGH (ref 15–41)
Albumin: 3.9 g/dL (ref 3.5–5.0)
Alkaline Phosphatase: 120 U/L (ref 38–126)
Anion gap: 8 (ref 5–15)
BUN: 16 mg/dL (ref 8–23)
CO2: 25 mmol/L (ref 22–32)
Calcium: 9.2 mg/dL (ref 8.9–10.3)
Chloride: 108 mmol/L (ref 98–111)
Creatinine, Ser: 0.75 mg/dL (ref 0.44–1.00)
GFR, Estimated: >60 mL/min (ref >60)
Glucose, Bld: 115 mg/dL — ABNORMAL HIGH (ref 70–99)
Potassium: 3.6 mmol/L (ref 3.5–5.1)
Sodium: 141 mmol/L (ref 135–145)
Total Bilirubin: 0.5 mg/dL (ref 0.3–1.2)
Total Protein: 7.8 g/dL (ref 6.5–8.1)

## 2021-06-03 LAB — CBC
HCT: 48.5 % — ABNORMAL HIGH (ref 36.0–46.0)
Hemoglobin: 15.8 g/dL — ABNORMAL HIGH (ref 12.0–15.0)
MCH: 30 pg (ref 26.0–34.0)
MCHC: 32.6 g/dL (ref 30.0–36.0)
MCV: 92.2 fL (ref 80.0–100.0)
Platelets: 190 10*3/uL (ref 150–400)
RBC: 5.26 MIL/uL — ABNORMAL HIGH (ref 3.87–5.11)
RDW: 13.3 % (ref 11.5–15.5)
WBC: 4.9 10*3/uL (ref 4.0–10.5)
nRBC: 0 % (ref 0.0–0.2)

## 2021-06-03 MED ORDER — ONDANSETRON 4 MG PO TBDP: 4.0000 mg | ORAL_TABLET | Freq: Three times a day (TID) | ORAL | 0 refills | Status: AC | PRN

## 2021-06-03 NOTE — ED Provider Notes (Signed)
[facility] Provider Note    Event Date/Time   First MD Initiated Contact with Patient 06/03/21 1506     (approximate)  History   Chief Complaint: Weakness and Diarrhea  HPI  Kathy Mclean is a 61 y.o. female with a past medical history of CAD, hyperlipidemia presents to the emergency department for diarrhea.  According to the patient since Wednesday she has been experiencing diarrhea.  She states frequent episodes of very watery diarrhea she has had several accidents.  Patient denies any abdominal pain.  States slight nausea at times had several episodes of vomiting on the first day but is not had any since.  No fever.  No urinary symptoms.  States she has tried loperamide and Pepto-Bismol at home without success so she came to the emergency department.   Physical Exam   Triage Vital Signs: ED Triage Vitals  Enc Vitals Group     BP 06/03/21 1401 (!) 171/90     Pulse Rate 06/03/21 1401 80     Resp 06/03/21 1401 16     Temp 06/03/21 1401 98.5 F (36.9 C)     Temp Source 06/03/21 1401 Oral     SpO2 06/03/21 1401 94 %     Weight --      Height --      Head Circumference --      Peak Flow --      Pain Score 06/03/21 1400 0     Pain Loc --      Pain Edu? --      Excl. in [place]? --     Most recent vital signs: Vitals:   06/03/21 1401 06/03/21 1515  BP: (!) 171/90 129/77  Pulse: 80 73  Resp: 16 18  Temp: 98.5 F (36.9 C)   SpO2: 94% 95%    General: Awake, no distress.  CV:  Good peripheral perfusion.  Regular rate and rhythm  Resp:  Normal effort.  Equal breath sounds bilaterally.  Abd:  No distention.  Soft, nontender.  No rebound or guarding.   ED Results / Procedures / Treatments    MEDICATIONS ORDERED IN ED: Medications - No data to display   IMPRESSION / MDM / Spring / ED COURSE  I reviewed the triage vital signs and the nursing notes.  Patient's presentation is most consistent with acute presentation with  potential threat to life or bodily function.  Patient presents emergency department for significant episodes of diarrhea per patient.  Patient states symptoms have been ongoing x3 days now.  Patient states initially with nausea and vomiting but the vomiting has since resolved continues to have frequent watery stool.  Patient tried Imodium without success has been trying Pepto-Bismol without success either so she came to the emergency department.  Here the patient appears overall well, reassuring vital signs, benign abdomen on my exam.  Patient's labs are reassuring including a normal urinalysis, normal CBC with a normal white blood cell count, reassuring chemistry including a normal anion gap.  Given the patient's frequent diarrhea I did offer to proceed with CT imaging to evaluate for possible diverticulitis or colitis as well as IV fluids.  Patient states she is reassured by the blood work and states she would prefer to avoid CT imaging.  I discussed with the patient to continue using Imodium 2 mg up to 4 times daily as well as I can prescribe Zofran to be used as an nausea medication if needed.  I discussed  very strict return precautions to the patient has not improved in the next 24 to 48 hours if she develops abdominal pain or fever she is to return to the emergency department otherwise she will follow-up with her doctor.  Patient agreeable to plan of care.  FINAL CLINICAL IMPRESSION(S) / ED DIAGNOSES   Diarrhea  Rx / DC Orders   Zofran Over-the-counter Imodium  Note:  This document was prepared using Dragon voice recognition software and may include unintentional dictation errors.   Harvest Dark, MD 06/03/21 1537

## 2021-06-03 NOTE — ED Triage Notes (Signed)
Pt reports that she has been having diarrhea and vomiting since Wednesday. Pt denies pain.
